# Patient Record
Sex: Male | Born: 1965 | Race: White | Hispanic: No | Marital: Single | Smoking: Never smoker
Health system: Southern US, Community
[De-identification: ages and names within clinical notes are randomized; demographics above are authoritative.]

## PROBLEM LIST (undated history)

## (undated) DIAGNOSIS — F29 Unspecified psychosis not due to a substance or known physiological condition: Secondary | ICD-10-CM

## (undated) DIAGNOSIS — F259 Schizoaffective disorder, unspecified: Secondary | ICD-10-CM

## (undated) DIAGNOSIS — G809 Cerebral palsy, unspecified: Secondary | ICD-10-CM

## (undated) DIAGNOSIS — K5900 Constipation, unspecified: Secondary | ICD-10-CM

## (undated) DIAGNOSIS — S88912A Complete traumatic amputation of left lower leg, level unspecified, initial encounter: Secondary | ICD-10-CM

## (undated) DIAGNOSIS — L720 Epidermal cyst: Secondary | ICD-10-CM

## (undated) DIAGNOSIS — S88911A Complete traumatic amputation of right lower leg, level unspecified, initial encounter: Secondary | ICD-10-CM

## (undated) HISTORY — PX: LEG AMPUTATION ABOVE KNEE: SHX117

---

## 2000-04-03 ENCOUNTER — Encounter: Admission: RE | Admit: 2000-04-03 | Discharge: 2000-07-02 | Payer: Self-pay | Admitting: Orthopedic Surgery

## 2001-04-13 ENCOUNTER — Encounter: Admission: RE | Admit: 2001-04-13 | Discharge: 2001-07-12 | Payer: Self-pay | Admitting: Internal Medicine

## 2002-12-20 ENCOUNTER — Encounter (HOSPITAL_BASED_OUTPATIENT_CLINIC_OR_DEPARTMENT_OTHER): Admission: RE | Admit: 2002-12-20 | Discharge: 2003-03-20 | Payer: Self-pay | Admitting: Internal Medicine

## 2002-12-29 ENCOUNTER — Encounter: Admission: RE | Admit: 2002-12-29 | Discharge: 2002-12-29 | Payer: Self-pay | Admitting: Internal Medicine

## 2002-12-29 ENCOUNTER — Encounter (HOSPITAL_BASED_OUTPATIENT_CLINIC_OR_DEPARTMENT_OTHER): Payer: Self-pay | Admitting: Internal Medicine

## 2003-03-18 ENCOUNTER — Encounter (HOSPITAL_BASED_OUTPATIENT_CLINIC_OR_DEPARTMENT_OTHER): Admission: RE | Admit: 2003-03-18 | Discharge: 2003-06-16 | Payer: Self-pay | Admitting: Internal Medicine

## 2003-05-01 ENCOUNTER — Emergency Department (HOSPITAL_COMMUNITY): Admission: EM | Admit: 2003-05-01 | Discharge: 2003-05-02 | Payer: Self-pay | Admitting: *Deleted

## 2003-07-04 ENCOUNTER — Encounter (HOSPITAL_BASED_OUTPATIENT_CLINIC_OR_DEPARTMENT_OTHER): Admission: RE | Admit: 2003-07-04 | Discharge: 2003-09-05 | Payer: Self-pay | Admitting: Internal Medicine

## 2003-11-15 ENCOUNTER — Encounter (HOSPITAL_BASED_OUTPATIENT_CLINIC_OR_DEPARTMENT_OTHER): Admission: RE | Admit: 2003-11-15 | Discharge: 2004-01-16 | Payer: Self-pay | Admitting: Internal Medicine

## 2004-03-13 ENCOUNTER — Encounter (HOSPITAL_BASED_OUTPATIENT_CLINIC_OR_DEPARTMENT_OTHER): Admission: RE | Admit: 2004-03-13 | Discharge: 2004-06-11 | Payer: Self-pay | Admitting: Internal Medicine

## 2004-08-13 ENCOUNTER — Encounter (HOSPITAL_BASED_OUTPATIENT_CLINIC_OR_DEPARTMENT_OTHER): Admission: RE | Admit: 2004-08-13 | Discharge: 2004-08-29 | Payer: Self-pay | Admitting: Internal Medicine

## 2005-02-27 ENCOUNTER — Encounter: Admission: RE | Admit: 2005-02-27 | Discharge: 2005-02-27 | Payer: Self-pay | Admitting: Internal Medicine

## 2005-09-23 ENCOUNTER — Ambulatory Visit: Payer: Self-pay | Admitting: Physical Medicine & Rehabilitation

## 2005-09-23 ENCOUNTER — Encounter (INDEPENDENT_AMBULATORY_CARE_PROVIDER_SITE_OTHER): Payer: Self-pay | Admitting: *Deleted

## 2005-09-23 ENCOUNTER — Inpatient Hospital Stay (HOSPITAL_COMMUNITY): Admission: RE | Admit: 2005-09-23 | Discharge: 2005-09-27 | Payer: Self-pay | Admitting: Vascular Surgery

## 2006-07-14 ENCOUNTER — Encounter: Admission: RE | Admit: 2006-07-14 | Discharge: 2006-10-12 | Payer: Self-pay | Admitting: Orthopaedic Surgery

## 2006-09-02 ENCOUNTER — Emergency Department (HOSPITAL_COMMUNITY): Admission: EM | Admit: 2006-09-02 | Discharge: 2006-09-02 | Payer: Self-pay | Admitting: Emergency Medicine

## 2006-12-10 ENCOUNTER — Emergency Department (HOSPITAL_COMMUNITY): Admission: EM | Admit: 2006-12-10 | Discharge: 2006-12-10 | Payer: Self-pay | Admitting: Emergency Medicine

## 2007-03-09 ENCOUNTER — Emergency Department (HOSPITAL_COMMUNITY): Admission: EM | Admit: 2007-03-09 | Discharge: 2007-03-09 | Payer: Self-pay | Admitting: Emergency Medicine

## 2007-03-31 ENCOUNTER — Encounter: Admission: RE | Admit: 2007-03-31 | Discharge: 2007-04-14 | Payer: Self-pay | Admitting: Internal Medicine

## 2007-04-15 ENCOUNTER — Emergency Department (HOSPITAL_COMMUNITY): Admission: EM | Admit: 2007-04-15 | Discharge: 2007-04-15 | Payer: Self-pay | Admitting: Emergency Medicine

## 2007-07-03 ENCOUNTER — Emergency Department (HOSPITAL_COMMUNITY): Admission: EM | Admit: 2007-07-03 | Discharge: 2007-07-03 | Payer: Self-pay | Admitting: Emergency Medicine

## 2007-07-20 ENCOUNTER — Emergency Department (HOSPITAL_COMMUNITY): Admission: EM | Admit: 2007-07-20 | Discharge: 2007-07-20 | Payer: Self-pay | Admitting: Emergency Medicine

## 2008-03-30 ENCOUNTER — Ambulatory Visit: Payer: Self-pay | Admitting: Internal Medicine

## 2008-03-30 ENCOUNTER — Inpatient Hospital Stay (HOSPITAL_COMMUNITY): Admission: AD | Admit: 2008-03-30 | Discharge: 2008-04-08 | Payer: Self-pay | Admitting: Internal Medicine

## 2008-03-30 ENCOUNTER — Encounter: Payer: Self-pay | Admitting: Emergency Medicine

## 2008-04-02 ENCOUNTER — Ambulatory Visit: Payer: Self-pay | Admitting: Infectious Diseases

## 2008-05-20 ENCOUNTER — Inpatient Hospital Stay (HOSPITAL_COMMUNITY): Admission: EM | Admit: 2008-05-20 | Discharge: 2008-05-25 | Payer: Self-pay | Admitting: Emergency Medicine

## 2008-05-20 ENCOUNTER — Ambulatory Visit: Payer: Self-pay | Admitting: Internal Medicine

## 2008-07-02 ENCOUNTER — Inpatient Hospital Stay (HOSPITAL_COMMUNITY): Admission: EM | Admit: 2008-07-02 | Discharge: 2008-07-05 | Payer: Self-pay | Admitting: Emergency Medicine

## 2008-08-11 ENCOUNTER — Emergency Department (HOSPITAL_COMMUNITY): Admission: EM | Admit: 2008-08-11 | Discharge: 2008-08-12 | Payer: Self-pay | Admitting: Emergency Medicine

## 2008-09-01 ENCOUNTER — Emergency Department (HOSPITAL_COMMUNITY): Admission: EM | Admit: 2008-09-01 | Discharge: 2008-09-01 | Payer: Self-pay | Admitting: Emergency Medicine

## 2008-10-04 ENCOUNTER — Inpatient Hospital Stay (HOSPITAL_COMMUNITY): Admission: EM | Admit: 2008-10-04 | Discharge: 2008-10-15 | Payer: Self-pay | Admitting: Emergency Medicine

## 2008-12-30 ENCOUNTER — Emergency Department (HOSPITAL_COMMUNITY): Admission: EM | Admit: 2008-12-30 | Discharge: 2008-12-30 | Payer: Self-pay | Admitting: Emergency Medicine

## 2009-01-07 ENCOUNTER — Emergency Department (HOSPITAL_COMMUNITY): Admission: EM | Admit: 2009-01-07 | Discharge: 2009-01-07 | Payer: Self-pay | Admitting: Emergency Medicine

## 2009-04-01 ENCOUNTER — Emergency Department (HOSPITAL_COMMUNITY): Admission: EM | Admit: 2009-04-01 | Discharge: 2009-04-01 | Payer: Self-pay | Admitting: Emergency Medicine

## 2010-04-17 IMAGING — CR DG CHEST 1V PORT
1 series · 1 of 1 positions shown · non-contrast
Comparison: 03/13/2008 at 3933 hours

CLINICAL DATA: Sepsis/fever

PORTABLE CHEST - 1 VIEW

[AP]
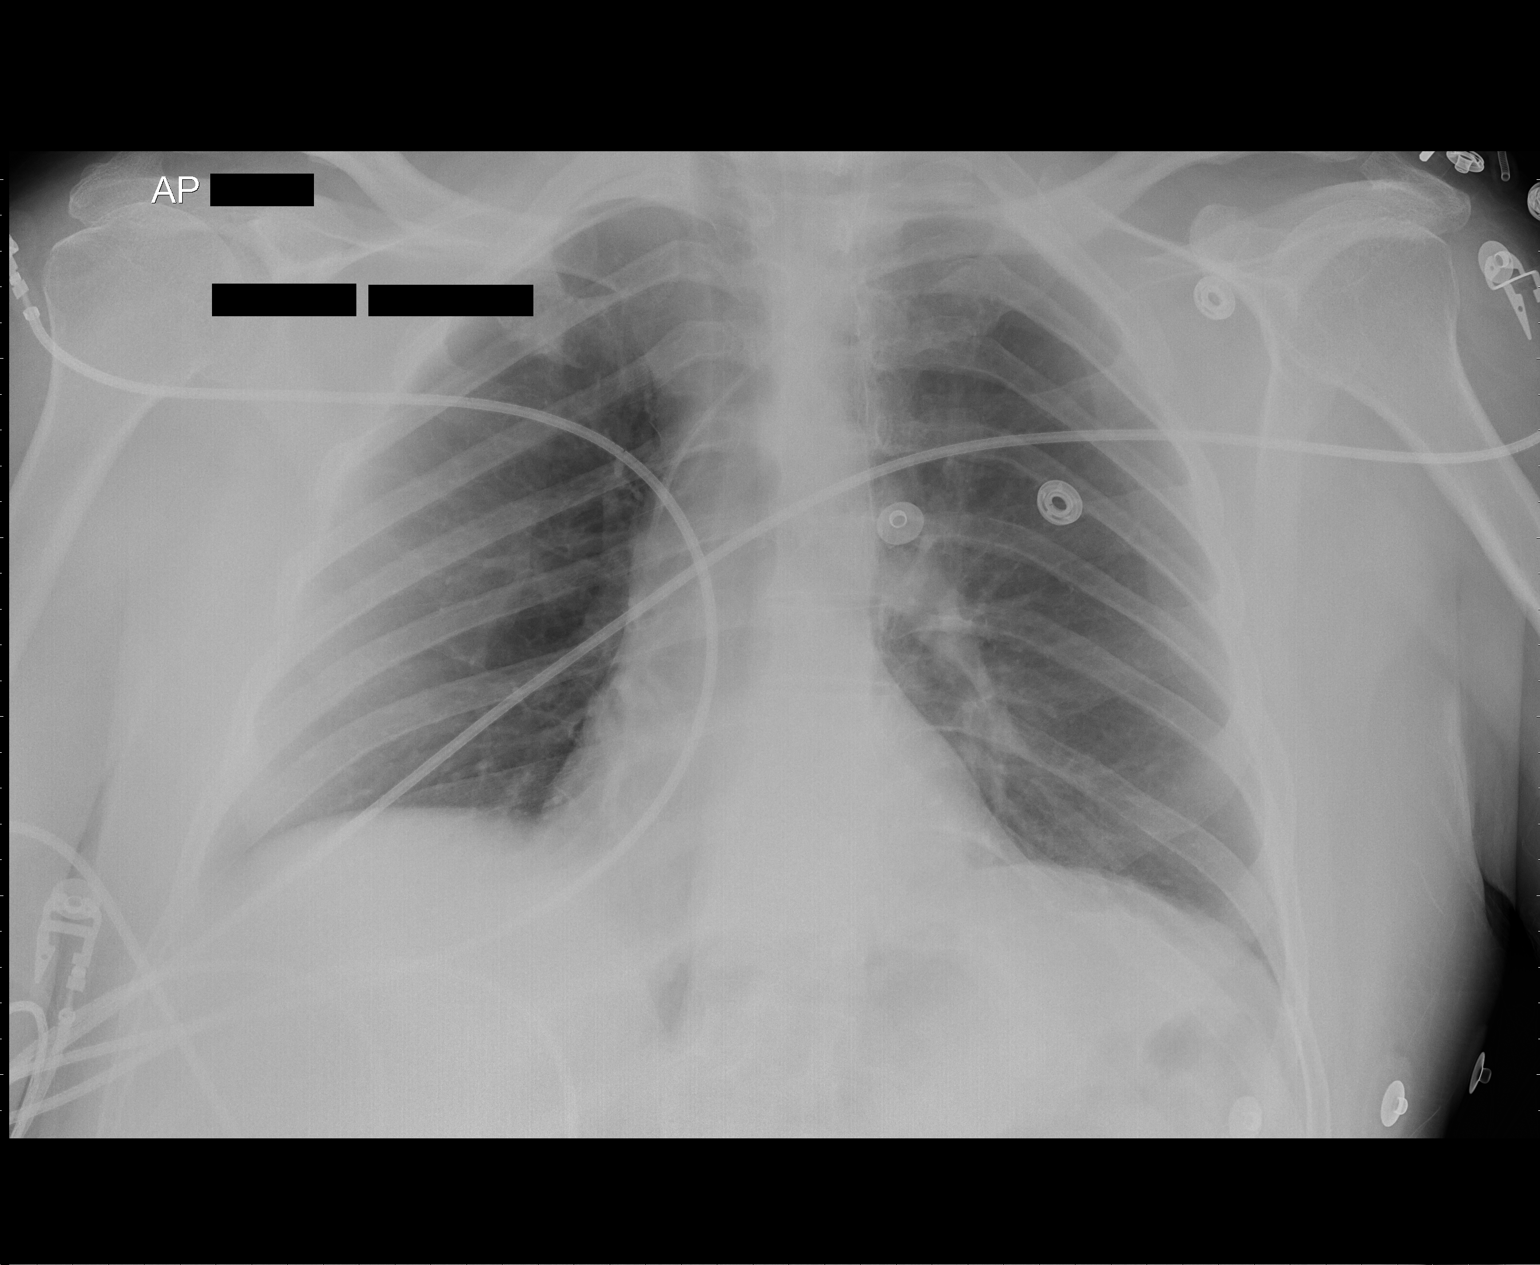

[1 of 1 positions shown; findings below may reference images not displayed]

FINDINGS: Heart size and contour remain normal.  Lungs clear.  A
left jugular central line has been placed with its tip in the
proximal SVC.  There is no pneumothorax. The stomach is no longer
distended.
IMPRESSION: 1.  No active cardiopulmonary disease.
2.  Left jugular central line placement as above with no immediate
complications.
3.  The stomach is no longer distended.

## 2010-04-18 IMAGING — CR DG ABD PORTABLE 1V
1 series · 1 of 1 positions shown · non-contrast
Comparison: CT abdomen pelvis 03/30/2008.

CLINICAL DATA: 41-year-old male with sepsis and fever.

ABDOMEN - 1 VIEW

[view not recorded]
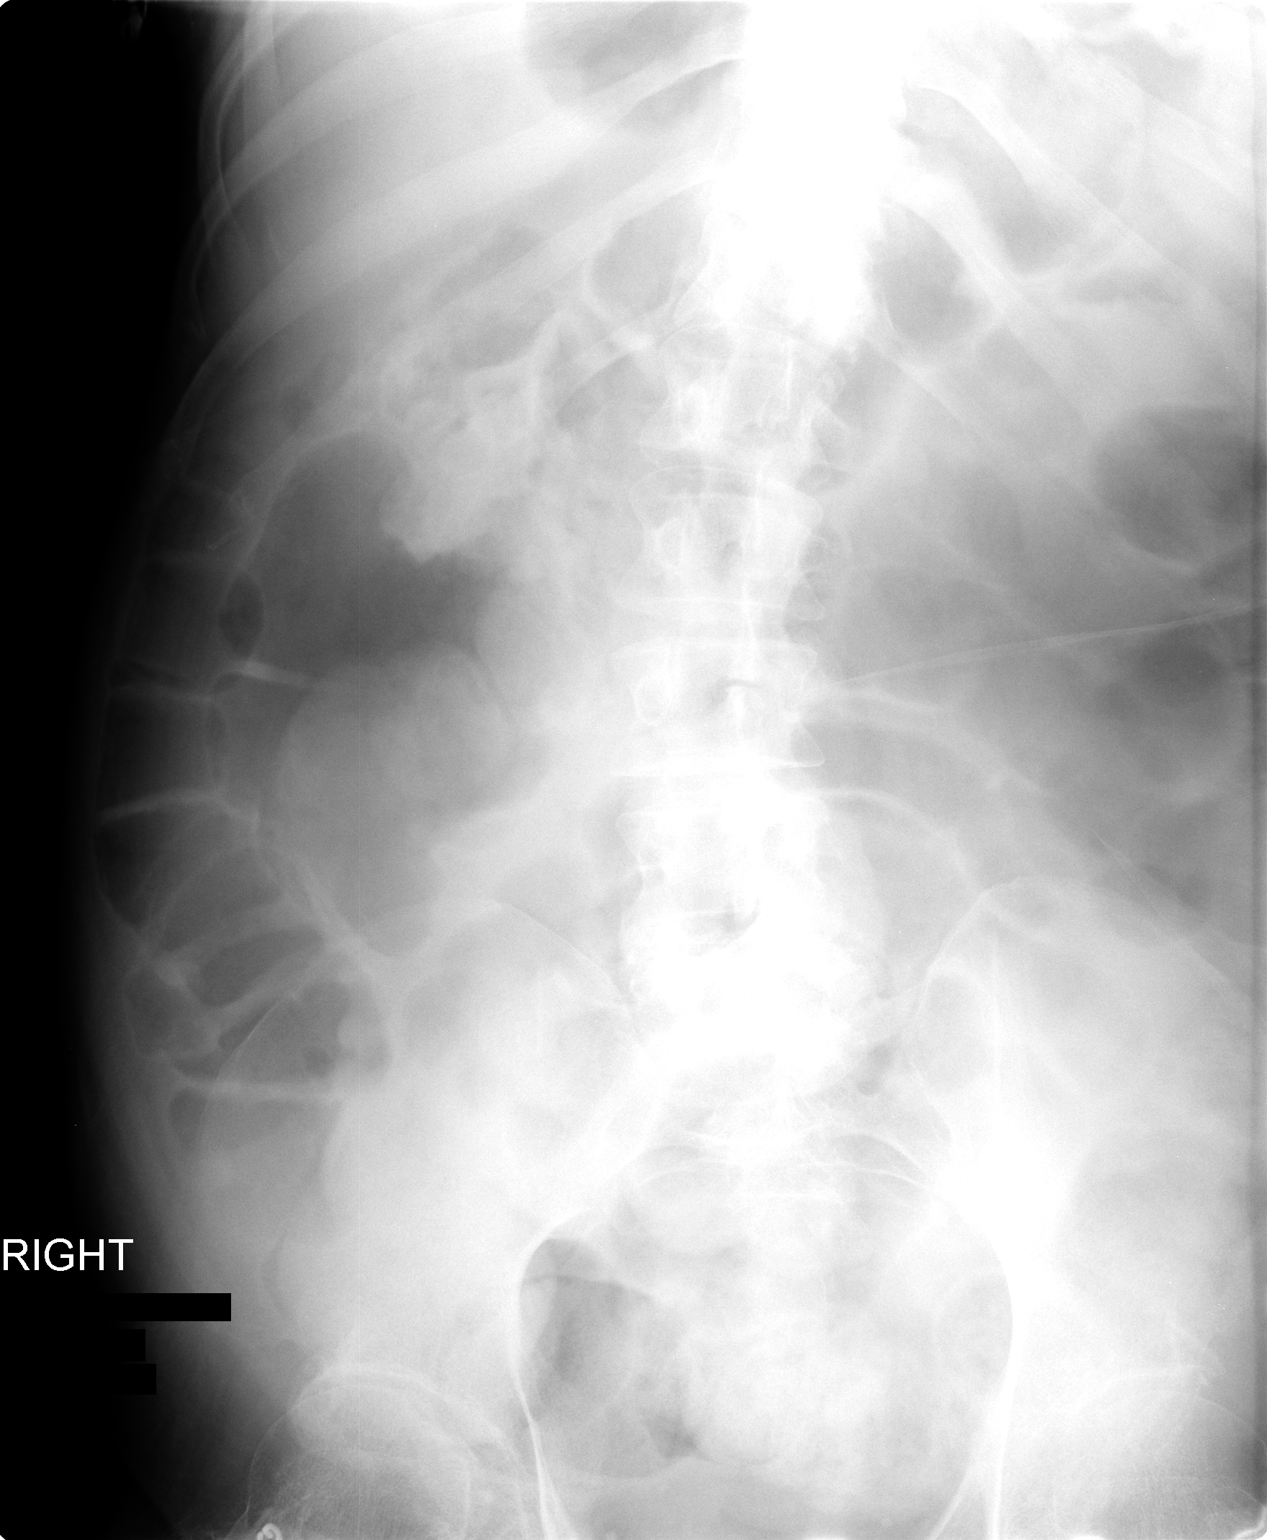

[1 of 1 positions shown; findings below may reference images not displayed]

FINDINGS: Portable view 7437 hours.  Increased gaseous distension
of bowel loops, but no definitely dilated loops are identified.
Visualized small bowel loops measure up to 3 cm in diameter, within
normal limits.  Retained CT oral contrast is evident.  Scoliosis.
The lung bases are not included.
IMPRESSION: Bowel gas pattern suggestive of ileus.  Retained CT oral contrast.

## 2010-10-04 LAB — DIFFERENTIAL
Basophils Absolute: 0 10*3/uL (ref 0.0–0.1)
Basophils Relative: 0 % (ref 0–1)
Eosinophils Absolute: 0.2 10*3/uL (ref 0.0–0.7)
Monocytes Absolute: 0.8 10*3/uL (ref 0.1–1.0)
Monocytes Relative: 5 % (ref 3–12)
Neutro Abs: 11.4 10*3/uL — ABNORMAL HIGH (ref 1.7–7.7)
Neutrophils Relative %: 81 % — ABNORMAL HIGH (ref 43–77)

## 2010-10-04 LAB — BASIC METABOLIC PANEL
CO2: 20 mEq/L (ref 19–32)
Calcium: 8.8 mg/dL (ref 8.4–10.5)
Chloride: 104 mEq/L (ref 96–112)
Creatinine, Ser: 0.63 mg/dL (ref 0.4–1.5)
Glucose, Bld: 106 mg/dL — ABNORMAL HIGH (ref 70–99)

## 2010-10-04 LAB — CBC
MCHC: 33.7 g/dL (ref 30.0–36.0)
MCV: 94.4 fL (ref 78.0–100.0)
RDW: 15.2 % (ref 11.5–15.5)

## 2010-10-07 LAB — DIFFERENTIAL
Basophils Relative: 0 % (ref 0–1)
Eosinophils Absolute: 0.1 10*3/uL (ref 0.0–0.7)
Monocytes Absolute: 0.7 10*3/uL (ref 0.1–1.0)
Monocytes Relative: 6 % (ref 3–12)

## 2010-10-07 LAB — HEPATIC FUNCTION PANEL
AST: 54 U/L — ABNORMAL HIGH (ref 0–37)
Albumin: 3.4 g/dL — ABNORMAL LOW (ref 3.5–5.2)
Bilirubin, Direct: <0.1 mg/dL (ref 0.0–0.3)
Total Bilirubin: 0.4 mg/dL (ref 0.3–1.2)

## 2010-10-07 LAB — CBC
HCT: 44.5 % (ref 39.0–52.0)
Hemoglobin: 15.1 g/dL (ref 13.0–17.0)
MCHC: 34 g/dL (ref 30.0–36.0)
MCV: 93.4 fL (ref 78.0–100.0)
RBC: 4.77 MIL/uL (ref 4.22–5.81)

## 2010-10-07 LAB — POCT I-STAT, CHEM 8
BUN: 10 mg/dL (ref 6–23)
Calcium, Ion: 1.14 mmol/L (ref 1.12–1.32)
Calcium, Ion: 1.19 mmol/L (ref 1.12–1.32)
Chloride: 107 mEq/L (ref 96–112)
Creatinine, Ser: 0.6 mg/dL (ref 0.4–1.5)
Glucose, Bld: 138 mg/dL — ABNORMAL HIGH (ref 70–99)
HCT: 42 % (ref 39.0–52.0)
HCT: 44 % (ref 39.0–52.0)
Hemoglobin: 15 g/dL (ref 13.0–17.0)
Sodium: 139 mEq/L (ref 135–145)
TCO2: 27 mmol/L (ref 0–100)

## 2010-10-07 LAB — URINALYSIS, ROUTINE W REFLEX MICROSCOPIC
Ketones, ur: NEGATIVE mg/dL
Nitrite: NEGATIVE
Protein, ur: NEGATIVE mg/dL
Urobilinogen, UA: 1 mg/dL (ref 0.0–1.0)

## 2010-10-07 LAB — LIPASE, BLOOD: Lipase: 24 U/L (ref 11–59)

## 2010-10-07 LAB — LACTIC ACID, PLASMA: Lactic Acid, Venous: 1.6 mmol/L (ref 0.5–2.2)

## 2010-10-07 LAB — POCT CARDIAC MARKERS: Troponin i, poc: <0.05 ng/mL (ref 0.00–0.09)

## 2010-10-10 LAB — URINALYSIS, ROUTINE W REFLEX MICROSCOPIC
Glucose, UA: NEGATIVE mg/dL
Hgb urine dipstick: NEGATIVE
Specific Gravity, Urine: 1.021 (ref 1.005–1.030)

## 2010-10-10 LAB — COMPREHENSIVE METABOLIC PANEL
ALT: 24 U/L (ref 0–53)
AST: 23 U/L (ref 0–37)
AST: 34 U/L (ref 0–37)
Albumin: 3 g/dL — ABNORMAL LOW (ref 3.5–5.2)
Albumin: 4.1 g/dL (ref 3.5–5.2)
Alkaline Phosphatase: 56 U/L (ref 39–117)
BUN: 6 mg/dL (ref 6–23)
CO2: 19 mEq/L (ref 19–32)
CO2: 20 mEq/L (ref 19–32)
Calcium: 9.5 mg/dL (ref 8.4–10.5)
Chloride: 101 mEq/L (ref 96–112)
Chloride: 112 mEq/L (ref 96–112)
Creatinine, Ser: 0.57 mg/dL (ref 0.4–1.5)
Creatinine, Ser: 0.6 mg/dL (ref 0.4–1.5)
GFR calc Af Amer: >60 mL/min (ref >60)
GFR calc Af Amer: >60 mL/min (ref >60)
GFR calc non Af Amer: >60 mL/min (ref >60)
GFR calc non Af Amer: >60 mL/min (ref >60)
Potassium: 4.2 mEq/L (ref 3.5–5.1)
Sodium: 139 mEq/L (ref 135–145)
Total Bilirubin: 1.2 mg/dL (ref 0.3–1.2)

## 2010-10-10 LAB — CBC
HCT: 38.3 % — ABNORMAL LOW (ref 39.0–52.0)
HCT: 35 % — ABNORMAL LOW (ref 39.0–52.0)
HCT: 37.9 % — ABNORMAL LOW (ref 39.0–52.0)
HCT: 38.4 % — ABNORMAL LOW (ref 39.0–52.0)
HCT: 40.3 % (ref 39.0–52.0)
HCT: 41.8 % (ref 39.0–52.0)
HCT: 48.2 % (ref 39.0–52.0)
Hemoglobin: 12 g/dL — ABNORMAL LOW (ref 13.0–17.0)
Hemoglobin: 13 g/dL (ref 13.0–17.0)
Hemoglobin: 13.4 g/dL (ref 13.0–17.0)
Hemoglobin: 13.4 g/dL (ref 13.0–17.0)
Hemoglobin: 13.5 g/dL (ref 13.0–17.0)
Hemoglobin: 16.8 g/dL (ref 13.0–17.0)
MCHC: 33.9 g/dL (ref 30.0–36.0)
MCHC: 34.4 g/dL (ref 30.0–36.0)
MCHC: 34.6 g/dL (ref 30.0–36.0)
MCHC: 34.8 g/dL (ref 30.0–36.0)
MCHC: 35.3 g/dL (ref 30.0–36.0)
MCHC: 35.3 g/dL (ref 30.0–36.0)
MCV: 98.3 fL (ref 78.0–100.0)
MCV: 98.5 fL (ref 78.0–100.0)
MCV: 98.6 fL (ref 78.0–100.0)
MCV: 98.8 fL (ref 78.0–100.0)
MCV: 98.8 fL (ref 78.0–100.0)
Platelets: 311 10*3/uL (ref 150–400)
Platelets: 226 10*3/uL (ref 150–400)
Platelets: 243 10*3/uL (ref 150–400)
Platelets: 258 10*3/uL (ref 150–400)
Platelets: 264 10*3/uL (ref 150–400)
Platelets: 265 10*3/uL (ref 150–400)
Platelets: ADEQUATE 10*3/uL (ref 150–400)
RBC: 3.5 MIL/uL — ABNORMAL LOW (ref 4.22–5.81)
RBC: 3.52 MIL/uL — ABNORMAL LOW (ref 4.22–5.81)
RBC: 3.65 MIL/uL — ABNORMAL LOW (ref 4.22–5.81)
RBC: 3.85 MIL/uL — ABNORMAL LOW (ref 4.22–5.81)
RBC: 3.89 MIL/uL — ABNORMAL LOW (ref 4.22–5.81)
RBC: 3.93 MIL/uL — ABNORMAL LOW (ref 4.22–5.81)
RBC: 4.08 MIL/uL — ABNORMAL LOW (ref 4.22–5.81)
RBC: 4.88 MIL/uL (ref 4.22–5.81)
RDW: 13.6 % (ref 11.5–15.5)
RDW: 13.3 % (ref 11.5–15.5)
RDW: 13.4 % (ref 11.5–15.5)
RDW: 13.4 % (ref 11.5–15.5)
RDW: 13.6 % (ref 11.5–15.5)
RDW: 13.7 % (ref 11.5–15.5)
RDW: 13.9 % (ref 11.5–15.5)
WBC: 7.4 10*3/uL (ref 4.0–10.5)
WBC: 11.3 10*3/uL — ABNORMAL HIGH (ref 4.0–10.5)
WBC: 11.6 10*3/uL — ABNORMAL HIGH (ref 4.0–10.5)
WBC: 15.2 10*3/uL — ABNORMAL HIGH (ref 4.0–10.5)
WBC: 6.3 10*3/uL (ref 4.0–10.5)
WBC: 9.1 10*3/uL (ref 4.0–10.5)
WBC: 9.7 10*3/uL (ref 4.0–10.5)

## 2010-10-10 LAB — BASIC METABOLIC PANEL
BUN: 1 mg/dL — ABNORMAL LOW (ref 6–23)
BUN: 2 mg/dL — ABNORMAL LOW (ref 6–23)
BUN: 1 mg/dL — ABNORMAL LOW (ref 6–23)
BUN: 2 mg/dL — ABNORMAL LOW (ref 6–23)
BUN: <1 mg/dL — ABNORMAL LOW (ref 6–23)
BUN: <1 mg/dL — ABNORMAL LOW (ref 6–23)
BUN: <1 mg/dL — ABNORMAL LOW (ref 6–23)
CO2: 11 mEq/L — ABNORMAL LOW (ref 19–32)
CO2: 14 mEq/L — ABNORMAL LOW (ref 19–32)
CO2: 23 mEq/L (ref 19–32)
CO2: 23 mEq/L (ref 19–32)
Calcium: 8.8 mg/dL (ref 8.4–10.5)
Calcium: 8.4 mg/dL (ref 8.4–10.5)
Calcium: 8.5 mg/dL (ref 8.4–10.5)
Calcium: 8.9 mg/dL (ref 8.4–10.5)
Chloride: 103 mEq/L (ref 96–112)
Chloride: 104 mEq/L (ref 96–112)
Chloride: 111 mEq/L (ref 96–112)
Chloride: 111 mEq/L (ref 96–112)
Chloride: 115 mEq/L — ABNORMAL HIGH (ref 96–112)
Chloride: 115 mEq/L — ABNORMAL HIGH (ref 96–112)
Creatinine, Ser: 0.57 mg/dL (ref 0.4–1.5)
Creatinine, Ser: 0.39 mg/dL — ABNORMAL LOW (ref 0.4–1.5)
Creatinine, Ser: 0.48 mg/dL (ref 0.4–1.5)
Creatinine, Ser: 0.49 mg/dL (ref 0.4–1.5)
Creatinine, Ser: 0.58 mg/dL (ref 0.4–1.5)
Creatinine, Ser: 0.75 mg/dL (ref 0.4–1.5)
GFR calc Af Amer: >60 mL/min (ref >60)
GFR calc Af Amer: >60 mL/min (ref >60)
GFR calc Af Amer: >60 mL/min (ref >60)
GFR calc Af Amer: >60 mL/min (ref >60)
GFR calc Af Amer: >60 mL/min (ref >60)
GFR calc non Af Amer: >60 mL/min (ref >60)
GFR calc non Af Amer: >60 mL/min (ref >60)
GFR calc non Af Amer: >60 mL/min (ref >60)
GFR calc non Af Amer: >60 mL/min (ref >60)
GFR calc non Af Amer: >60 mL/min (ref >60)
Glucose, Bld: 152 mg/dL — ABNORMAL HIGH (ref 70–99)
Glucose, Bld: 376 mg/dL — ABNORMAL HIGH (ref 70–99)
Glucose, Bld: 66 mg/dL — ABNORMAL LOW (ref 70–99)
Glucose, Bld: 68 mg/dL — ABNORMAL LOW (ref 70–99)
Potassium: 4 mEq/L (ref 3.5–5.1)
Potassium: 4 mEq/L (ref 3.5–5.1)
Potassium: 3 mEq/L — ABNORMAL LOW (ref 3.5–5.1)
Potassium: 3.3 mEq/L — ABNORMAL LOW (ref 3.5–5.1)
Potassium: 3.3 mEq/L — ABNORMAL LOW (ref 3.5–5.1)
Potassium: 3.6 mEq/L (ref 3.5–5.1)
Potassium: 3.9 mEq/L (ref 3.5–5.1)
Potassium: 6 mEq/L — ABNORMAL HIGH (ref 3.5–5.1)
Sodium: 136 mEq/L (ref 135–145)
Sodium: 137 mEq/L (ref 135–145)
Sodium: 139 mEq/L (ref 135–145)
Sodium: 140 mEq/L (ref 135–145)
Sodium: 141 mEq/L (ref 135–145)
Sodium: 143 mEq/L (ref 135–145)

## 2010-10-10 LAB — GLUCOSE, CAPILLARY
Glucose-Capillary: 109 mg/dL — ABNORMAL HIGH (ref 70–99)
Glucose-Capillary: 110 mg/dL — ABNORMAL HIGH (ref 70–99)
Glucose-Capillary: 113 mg/dL — ABNORMAL HIGH (ref 70–99)
Glucose-Capillary: 115 mg/dL — ABNORMAL HIGH (ref 70–99)
Glucose-Capillary: 98 mg/dL (ref 70–99)
Glucose-Capillary: 110 mg/dL — ABNORMAL HIGH (ref 70–99)
Glucose-Capillary: 111 mg/dL — ABNORMAL HIGH (ref 70–99)
Glucose-Capillary: 112 mg/dL — ABNORMAL HIGH (ref 70–99)
Glucose-Capillary: 120 mg/dL — ABNORMAL HIGH (ref 70–99)
Glucose-Capillary: 127 mg/dL — ABNORMAL HIGH (ref 70–99)
Glucose-Capillary: 144 mg/dL — ABNORMAL HIGH (ref 70–99)
Glucose-Capillary: 145 mg/dL — ABNORMAL HIGH (ref 70–99)
Glucose-Capillary: 148 mg/dL — ABNORMAL HIGH (ref 70–99)
Glucose-Capillary: 150 mg/dL — ABNORMAL HIGH (ref 70–99)
Glucose-Capillary: 92 mg/dL (ref 70–99)
Glucose-Capillary: 93 mg/dL (ref 70–99)

## 2010-10-10 LAB — DIFFERENTIAL
Basophils Absolute: 0 10*3/uL (ref 0.0–0.1)
Basophils Absolute: 0 10*3/uL (ref 0.0–0.1)
Basophils Absolute: 0.1 10*3/uL (ref 0.0–0.1)
Basophils Relative: 0 % (ref 0–1)
Basophils Relative: 0 % (ref 0–1)
Basophils Relative: 1 % (ref 0–1)
Eosinophils Absolute: 0 10*3/uL (ref 0.0–0.7)
Eosinophils Absolute: 0.2 10*3/uL (ref 0.0–0.7)
Eosinophils Absolute: 0.2 10*3/uL (ref 0.0–0.7)
Eosinophils Absolute: 0.3 10*3/uL (ref 0.0–0.7)
Eosinophils Relative: 0 % (ref 0–5)
Lymphocytes Relative: 17 % (ref 12–46)
Lymphs Abs: 1.7 10*3/uL (ref 0.7–4.0)
Monocytes Absolute: 0.5 10*3/uL (ref 0.1–1.0)
Monocytes Absolute: 0.8 10*3/uL (ref 0.1–1.0)
Monocytes Absolute: 1.1 10*3/uL — ABNORMAL HIGH (ref 0.1–1.0)
Neutro Abs: 13.8 10*3/uL — ABNORMAL HIGH (ref 1.7–7.7)
Neutro Abs: 4.5 10*3/uL (ref 1.7–7.7)
Neutro Abs: 8.2 10*3/uL — ABNORMAL HIGH (ref 1.7–7.7)
Neutrophils Relative %: 67 % (ref 43–77)
Neutrophils Relative %: 72 % (ref 43–77)
Neutrophils Relative %: 81 % — ABNORMAL HIGH (ref 43–77)

## 2010-10-10 LAB — PHOSPHORUS
Phosphorus: 2.8 mg/dL (ref 2.3–4.6)
Phosphorus: 3 mg/dL (ref 2.3–4.6)

## 2010-10-10 LAB — TYPE AND SCREEN
ABO/RH(D): A POS
Antibody Screen: NEGATIVE

## 2010-10-10 LAB — CULTURE, BLOOD (ROUTINE X 2)

## 2010-10-10 LAB — HEMOCCULT GUIAC POC 1CARD (OFFICE): Fecal Occult Bld: POSITIVE

## 2010-10-10 LAB — MAGNESIUM: Magnesium: 1.5 mg/dL (ref 1.5–2.5)

## 2010-10-10 LAB — H. PYLORI ANTIBODY, IGG: H Pylori IgG: 0.5 {ISR}

## 2010-10-11 LAB — BASIC METABOLIC PANEL
BUN: 8 mg/dL (ref 6–23)
CO2: 25 mEq/L (ref 19–32)
Chloride: 108 mEq/L (ref 96–112)
Creatinine, Ser: 0.55 mg/dL (ref 0.4–1.5)
Glucose, Bld: 100 mg/dL — ABNORMAL HIGH (ref 70–99)

## 2010-10-11 LAB — URINALYSIS, ROUTINE W REFLEX MICROSCOPIC
Bilirubin Urine: NEGATIVE
Ketones, ur: NEGATIVE mg/dL
Nitrite: NEGATIVE
Protein, ur: NEGATIVE mg/dL
Urobilinogen, UA: 1 mg/dL (ref 0.0–1.0)

## 2010-10-11 LAB — DIFFERENTIAL
Basophils Absolute: 0 10*3/uL (ref 0.0–0.1)
Basophils Relative: 1 % (ref 0–1)
Eosinophils Absolute: 0.1 10*3/uL (ref 0.0–0.7)
Eosinophils Relative: 2 % (ref 0–5)
Monocytes Absolute: 0.5 10*3/uL (ref 0.1–1.0)

## 2010-10-11 LAB — CBC
MCHC: 33.7 g/dL (ref 30.0–36.0)
MCV: 98.8 fL (ref 78.0–100.0)
Platelets: 259 10*3/uL (ref 150–400)
RBC: 4.71 MIL/uL (ref 4.22–5.81)

## 2010-10-15 LAB — DIFFERENTIAL
Basophils Absolute: 0 10*3/uL (ref 0.0–0.1)
Eosinophils Absolute: 0.1 10*3/uL (ref 0.0–0.7)
Eosinophils Relative: 1 % (ref 0–5)
Lymphocytes Relative: 19 % (ref 12–46)
Neutrophils Relative %: 71 % (ref 43–77)

## 2010-10-15 LAB — CBC
HCT: 49.4 % (ref 39.0–52.0)
Platelets: 327 10*3/uL (ref 150–400)
RDW: 13.8 % (ref 11.5–15.5)

## 2010-10-15 LAB — BASIC METABOLIC PANEL
BUN: 6 mg/dL (ref 6–23)
CO2: 22 mEq/L (ref 19–32)
CO2: 23 mEq/L (ref 19–32)
Chloride: 106 mEq/L (ref 96–112)
Creatinine, Ser: 0.69 mg/dL (ref 0.4–1.5)
GFR calc Af Amer: >60 mL/min (ref >60)
GFR calc non Af Amer: >60 mL/min (ref >60)
Glucose, Bld: 100 mg/dL — ABNORMAL HIGH (ref 70–99)
Glucose, Bld: 99 mg/dL (ref 70–99)
Potassium: 3.4 mEq/L — ABNORMAL LOW (ref 3.5–5.1)
Potassium: 4.1 mEq/L (ref 3.5–5.1)
Sodium: 137 mEq/L (ref 135–145)
Sodium: 139 mEq/L (ref 135–145)

## 2010-10-15 LAB — COMPREHENSIVE METABOLIC PANEL
Albumin: 3.4 g/dL — ABNORMAL LOW (ref 3.5–5.2)
BUN: 2 mg/dL — ABNORMAL LOW (ref 6–23)
Creatinine, Ser: 0.49 mg/dL (ref 0.4–1.5)
Potassium: 4.1 mEq/L (ref 3.5–5.1)
Total Protein: 7 g/dL (ref 6.0–8.3)

## 2010-10-15 LAB — GLUCOSE, CAPILLARY: Glucose-Capillary: 102 mg/dL — ABNORMAL HIGH (ref 70–99)

## 2010-10-16 LAB — DIFFERENTIAL
Basophils Absolute: 0 10*3/uL (ref 0.0–0.1)
Eosinophils Relative: 2 % (ref 0–5)
Lymphocytes Relative: 24 % (ref 12–46)
Lymphs Abs: 1.8 10*3/uL (ref 0.7–4.0)
Monocytes Absolute: 0.5 10*3/uL (ref 0.1–1.0)
Monocytes Relative: 6 % (ref 3–12)

## 2010-10-16 LAB — URINALYSIS, ROUTINE W REFLEX MICROSCOPIC
Bilirubin Urine: NEGATIVE
Hgb urine dipstick: NEGATIVE
Nitrite: NEGATIVE
Specific Gravity, Urine: 1.018 (ref 1.005–1.030)
pH: 7.5 (ref 5.0–8.0)

## 2010-10-16 LAB — BASIC METABOLIC PANEL
CO2: 26 mEq/L (ref 19–32)
GFR calc non Af Amer: >60 mL/min (ref >60)
Glucose, Bld: 105 mg/dL — ABNORMAL HIGH (ref 70–99)
Potassium: 4 mEq/L (ref 3.5–5.1)
Sodium: 137 mEq/L (ref 135–145)

## 2010-10-16 LAB — CBC
HCT: 51 % (ref 39.0–52.0)
Hemoglobin: 17.4 g/dL — ABNORMAL HIGH (ref 13.0–17.0)
RBC: 5.19 MIL/uL (ref 4.22–5.81)
RDW: 13.4 % (ref 11.5–15.5)

## 2010-11-13 NOTE — Discharge Summary (Signed)
[name], [name]                ACCOUNT NO.:  [id number]   MEDICAL RECORD NO.:  [id number]          PATIENT TYPE:  INP   LOCATION:  3032                         FACILITY:  [facility]   PHYSICIAN:  Ladell Pier, M.D.   DATE OF BIRTH:  28-Jul-1965   DATE OF ADMISSION:  10/04/2008  DATE OF DISCHARGE:  10/15/2008                               DISCHARGE SUMMARY   ADDENDUM:   DISCHARGE DIAGNOSIS:  As per Dr. Belia Heman. Comer's dictation.   DISCHARGE MEDICATIONS:  1. Diazepam 5 mg daily.  2. Fluoxetine 40 mg daily.  3. Tizanidine 2 mg t.i.d.  4. Omeprazole 20 mg twice daily.  5. Reglan 5 mg b.i.d.  6. Benzamine 2 mg t.i.d.  7. Amitiza 24 mcg b.i.d.  8. Zyprexa 20 mg nightly.  9. Baclofen 10 mg nightly.  10.Meclizine 25 mg daily.  11.Keflex 250 mg q.i.d. times 6 days.  12.Erythromycin 500 mg four times a day.  Will verify with GI.  Dr.      Petra Kuba is on call. I paged him to verify the erythromycin.  13.The patient to be discharged with MiraLax 17 grams in 8 ounces of      water twice daily.   FOLLOW-UP APPOINTMENTS:  1. The patient was told to follow up with Dr. Carman Ching in two      weeks.  2. To follow up with Dr. __________ in one week.   HOSPITAL COURSE:  As per Dr. Ephriam Knuckles discharge summary, the patient was  added admitted with ileus.  GI was consulted who saw the patient  throughout the hospitalization.  He had a rectal tube placed and he was  given MiraLax and placed on erythromycin throughout the course of his  hospitalization.  His ileus improved clinically and also by x-ray.   The patient will be discharged home, to follow up with GI and also with  his primary care doctor.   LABORATORY DATA:  Labs at the time of discharge revealed on a BMP:  Sodium 137, potassium 4.0, chloride 104, CO2 of 27, glucose 118, BUN  2.creatinine 0.57, calcium 8.8.  WBC 7.4, hemoglobin 13.5, platelets  311, MCV 98.2.  Abdominal x-ray shows improved ileus.      Ladell Pier,  M.D.     [place]/MEDQ  D:  10/15/2008  T:  10/15/2008  Job:  [id number]

## 2010-11-13 NOTE — Op Note (Signed)
NAMEJOAS, MOTTON NO.:  [id number]   MEDICAL RECORD NO.:  [id number]          PATIENT TYPE:  EMS   LOCATION:  MAJO                         FACILITY:  [facility]   PHYSICIAN:  Nelda Bucks, MD DATE OF BIRTH:  [birth year]   DATE OF PROCEDURE:  DATE OF DISCHARGE:  03/29/2008                               OPERATIVE REPORT   Central line placement.   Consent was obtained from the patient, was alert and oriented x3.  He  understands the risks factors including infection, bleeding, and  pneumothorax.  The patient was placed in slightly Trendelenburg  position.  Chlorhexidine preparation was used over left internal jugular  site for sterilization.  Ultrasound guidance was used in a sterile  fashion for this procedure.  Ultrasound identified in left IJ appeared  somewhat dry with some collapsing during inspiration with Trendelenburg  position.  This IJ improved to some degree, although clearly appeared  hypovolemic.  A 7 mL of lidocaine was insufflated into the surgical  site.  Seldinger technique was used using guidewire.  Placement which  was also directly visualized under ultrasound.  A [place] line was placed  approximately 20 cm.  The patient tolerated the procedure well.  Limited  blood loss of less than 1 mL with no hemodynamic insult.  Appropriate  chest x-ray is pending at this time.      Nelda Bucks, MD  Electronically Signed     DJF/MEDQ  D:  03/30/2008  T:  03/31/2008  Job:  [id number]

## 2010-11-13 NOTE — H&P (Signed)
NAMEADVAITH, LAMARQUE                ACCOUNT NO.:  [id number]   MEDICAL RECORD NO.:  [id number]          PATIENT TYPE:  INP   LOCATION:  3032                         FACILITY:  [facility]   PHYSICIAN:  Renee Ramus, MD       DATE OF BIRTH:  Apr 23, 1966   DATE OF ADMISSION:  10/04/2008  DATE OF DISCHARGE:                              HISTORY & PHYSICAL   PRIMARY CARE PHYSICIAN:  Albertina Senegal, MD in [facility].   HISTORY OF PRESENT ILLNESS:  The patient is a 45 year old male with a  longstanding history of multiple admissions for recurrent ileus.  The  patient now complains of nausea, vomiting, and low-grade fever.  The  patient has been sunning himself and does have erythema on his skin  consistent with mild sunburn.  The patient reports having progressive  nausea and vomiting consistent with his previous admissions for ileus.  The patient is now being admitted to our service for further evaluation  and treatment.  The patient did have an x-ray in the emergency room that  showed a question of ileus versus Ogilvie syndrome, but the patient is  already beginning to feel better and hopefully, this will pass as the  previous episodes have.   PAST MEDICAL HISTORY:  1. Cerebral palsy.  2. Mental retardation.  3. 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  4. Type 2 diabetes mellitus, not treated.  5. Gastroesophageal reflux disease.  6. Peripheral vascular disease.  7. Anxiety.  8. Depression.  9. Status post esophageal dilatation.  10.Status post several admissions for ileus.   SOCIAL HISTORY:  No alcohol, no tobacco use.   FAMILY HISTORY:  Not available.   REVIEW OF SYSTEMS:  All other comprehensive review of systems is  negative.   ALLERGIES:  The patient has no known drug allergies.   CURRENT MEDICATIONS:  1. Diazepam 5 mg p.o. daily.  2. Fluoxetine 40 mg p.o. daily.  3. Tizanidine hydrochloride 2 mg p.o. t.i.d.  4. Omeprazole 20 mg p.o. b.i.d.  5. Metoclopramide  5 mg p.o. b.i.d.  6. Benztropine 2 mg p.o. t.i.d.  7. Amitiza 24 mcg b.i.d.  8. Zyprexa 20 mg p.o. nightly.  9. Baclofen 10 mg p.o. nightly.  10.Meclizine 25 mg p.o. daily.   PHYSICAL EXAMINATION:  GENERAL:  A well-developed and well-nourished  white male, currently in no apparent distress.  VITAL SIGNS:  Blood pressure 130/90, heart rate 105, temperature 99.5,  and respiratory rate 22.  HEENT:  The patient has a well-demarcated farmer's tan with erythema,  but no blistering.  The patient's pupils are equal and reactive to light  and accommodation.  Extraocular muscles appear to be intact.  CARDIOVASCULAR:  He has a regular rate and rhythm without murmurs, rubs,  or gallops.  PULMONARY:  Lungs are clear to auscultation bilaterally.  ABDOMEN:  Soft, nontender, and slightly distended.  Bowel sounds are not  present.  EXTREMITIES:  He is status post bilateral AKA's.  NEUROLOGIC:  Cranial nerves II-XII are grossly intact.  He has no focal  neurological deficits.   STUDIES:  1. 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  2. Chest x-ray shows gas in the stomach, but no acute cardiopulmonary      disease.   LABORATORY DATA:  White count 15.2, H and H 16.8 and 48, MCV 99,  platelets 259.  Sodium 139, potassium 4.8, chloride 101, bicarb 19, BUN  9, creatinine 0.6, and glucose 72.   ASSESSMENT AND PLAN:  1. Recurrent ileus.  The patient has no evidence of strangulation.  We      will make the patient n.p.o. except for ice chips, place G-tube,      continue IV fluids, and if the patient does show signs of      strangulation, consider either surgical consult or Gastrografin.  2. Cerebral palsy and mental retardation, currently stable.  3. Diabetes mellitus, type 2.  The patient is diet controlled, does      not require treatment.  4. Gastroesophageal reflux disease.  Continue proton pump inhibitor,      albeit IV.  5. Peripheral vascular disease, currently stable.  6.  Depression.  Hold selective serotonin reuptake inhibitor and all      p.o. medications.  7. Anxiety.  Continue to give Ativan p.r.n. as well as Zyprexa Zydis.   DISPOSITION:  The patient is full code.  H and P was constructed by  reviewing past medical history, conferring with emergency medical room  physician, and reviewing the emergency medical record.   TIME SPENT:  1 hour.      Renee Ramus, MD  Electronically Signed     JF/MEDQ  D:  10/04/2008  T:  10/05/2008  Job:  [id number]   cc:   Albertina Senegal, MD

## 2010-11-13 NOTE — Consult Note (Signed)
[name], [name] NO.:  [id number]   MEDICAL RECORD NO.:  [id number]          PATIENT TYPE:  EMS   LOCATION:  MAJO                         FACILITY:  [facility]   PHYSICIAN:  Nelda Bucks, MD DATE OF BIRTH:  [birth year]   DATE OF CONSULTATION:  03/30/2008  DATE OF DISCHARGE:                                 CONSULTATION   REASON FOR CONSULTATION:  Fever/sepsis.   HISTORY OF PRESENT ILLNESS:  This is a pleasant 45 year old male patient  with known history of cerebral palsy who lives in a group home.  He  presents today on March 30, 2008 reporting sudden onset of fever,  first noted on March 29, 2008 with associated chills.  Denied cough,  shortness of breath, did have some associated nausea and abdominal  discomfort with fever.  He has had mild increased abdominal distention,  reports no complaint of constipation, no changes in urinary patterns,  denies dysuria, hesitancy, or incontinence.  Denies any recent sick  exposures.  Presented today without any other significant pertinent  positives as associated above.  He did have on presentation a fever of  greater than 103, with elevated white blood cell count.  Because of  abdominal complaints, he had a CT abdomen obtained which showed gaseous  distention, and the significant fecal impaction.  The Pulmonary Critical  Care Team was asked to evaluate [name] on the basis of fever, signs  and symptoms of sepsis, and concern for progression of hypotension.   PAST MEDICAL HISTORY:  1. Cerebral palsy.  2. Anxiety.  3. Depression.  4. Diabetes type 2.  5. Bilateral AKAs.  6. Gastroesophageal reflux disease.   ALLERGIES:  No known drug allergies.   HOME MEDICATIONS:  Baclofen, benztropine, fluoxetine, lactulose,  metoclopramide, omeprazole, Singulair, Zyprexa, and vitamin C.   SOCIAL HISTORY:  He is a nonsmoker, has a history of EtOH abuse, but not  drinking at current group home.  He currently lives  at [facility].  He is mobile with an Passenger transport manager.   FAMILY HISTORY:  Not applicable.   REVIEW OF SYSTEMS:  Per HPI.   PHYSICAL EXAMINATION:  VITAL SIGNS:  T-max 103.3, heart rate 119 normal  sinus/sinus tach, blood pressure 100/60, respirations 20, and  saturations 98% on room air.  GENERAL:  No acute distress.  HEENT:  Normocephalic.  NECK:  Large, neck veins are flat, and completely collapsible on  ultrasound exam prior to triple-lumen insertion.  PULMONARY:  Clear to auscultation.  CARDIAC:  Regular rate and rhythm, tachy.  No murmurs.  EXTREMITIES:  Notable for bilateral AKA, they are warm, otherwise  unremarkable.  ABDOMEN:  Distended, tympanic percussion, tinkling hyperactive bowel  sounds noted.  GU:  Has a Foley catheter in place.  NEUROLOGIC:  Intact.   LABORATORY DATA:  White blood cell count 19, hemoglobin 16.1, platelet  count 226, neutrophil count 95%, and granulocytes 18.  Sodium 136,  potassium 4.3, chloride 102, CO2 24, BUN 14, creatinine 0.9, glucose  110, AST 35, ALT 35, and lipase 19.  Fecal occult blood negative.  Urinalysis  negative.  Lactic acid 4.6, now down to 3.6.  Urinalysis  negative.  Chest x-ray clear.  CT film of the abdomen demonstrating  gaseous distention, fecal impaction.  CT abdomen/pelvis, large amount of  stools in the rectum and sigmoid colon, bilateral inguinal nodes noted.   IMPRESSION AND PLAN:  1. Systemic inflammatory response syndrome/sepsis.  Suspect abdominal      source, would consider highly bacterial translocation from gut in      the setting of profound constipation.  Additionally, it could be      certainly complicated by volume depletion in the setting of fever.      Plan at this point is to follow up on culture data, empirically      cover with mono therapy Zosyn.  Agree with current admission.  Plan      to move on to step-down setting.  A triple-lumen catheter has been      placed, we will transduce CVP,  and aggressively hydrate.  Should he      need IV pressors, we will transfer to Intensive Care in modified      goal-directed therapy.  Hold p.o. meds as indicated by primary care      team.  Again, should he decompensate at this point, CT of abdomen      was done without contrast, should we notice further clinical      decompensation, specifically hypotension/signs and symptoms      consistent with shock, would again transfer to ICU, as well as      obtain CT abdomen and pelvis with contrast to further evaluate for      evidence of ischemia.  2. Constipation and fecal impaction.  Plan for this is to provide      laxatives, digital disimpaction, and followup abdominal film in the      morning.  3. Lactic acidosis with normal serum bicarb.  Plan for this is to      continue IV hydration, recheck lactic acid, his lactic acid is      improving.  Be sure there are not any current medication problems.  4. Diabetes type 2.  Plan for this is sliding scale insulin.  5. Gastroesophageal reflux disease.  Plan for proton pump inhibitor.   BEST PRACTICE:  Agree with subcu heparin, and PPI.      Zenia Resides, NP      Nelda Bucks, MD  Electronically Signed    PB/MEDQ  D:  03/30/2008  T:  03/30/2008  Job:  [id number]

## 2010-11-13 NOTE — Discharge Summary (Signed)
[name], [name]                ACCOUNT NO.:  [id number]   MEDICAL RECORD NO.:  [id number]          PATIENT TYPE:  INP   LOCATION:  1430                         [facility]ACILITY[facility]  [facility]   PHYSICIAN:  Hillery Aldo, M.D.   DATE OF BIRTH:  Nov 12, 1965   DATE OF ADMISSION:  03/30/2008  DATE OF DISCHARGE:  04/08/2008                               DISCHARGE SUMMARY   PRIMARY CARE PHYSICIAN:  Dr. Julius Bowels in [place], [place].   DISCHARGE SUMMARY ADDENDUM:  For complete list of the discharge  diagnoses, consultants, procedures and diagnostic studies, allergies,  chief complaint, history of present illness, and hospital course through  April 05, 2008, please see the previously dictated discharge summary  done by Dr. Ashley Royalty.   DISCHARGE MEDICATIONS:  1. Doxycycline 100 mg b.i.d. x2 weeks.  2. Baclofen 10 mg every night.  3. Benztropine 2 mg t.i.d.  4. Fluoxetine 40 mg daily.  5. Lactulose 30 mL b.i.d.  6. Reglan 5 mg b.i.d.  7. Singulair 10 mg every night.  8. Zyprexa 20 mg every night.  9. Vitamin C 500 mg daily.  10.Clotrimizole 1% cream topically twice daily.  11.Ketoconazole 2% cream topically twice daily.  12.Mucinex 600 CR every 12 hours.  13.Omeprazole 20 mg b.i.d.  14.Pataday 0.2% 1 drop to affected eyes daily.  15.Polyethylene glycol 3350 powder powder 2 caps twice daily, decrease      to daily for diarrhea.  16.Valium 5 mg once daily as needed.  17.Vicodin 5/500 1 to 2 every 4 to 6 hours as needed for pain.   ADDITIONAL DIAGNOSTIC STUDIES:  1. MRI on April 05, 2008, showed diffuse subcutaneous edema      consistent with cellulitis and mild myositis involving the vastus      lateralis muscle.  No soft tissue abscess or underlying      osteomyelitis.  2. KUB on April 07, 2008, showed findings most consistent with a      colonic ileus with no significant changes.   DISCHARGE LABORATORY VALUES:  Sodium was 139, potassium 3.6, chloride  106, bicarb 26, BUN 4,  creatinine 0.58, glucose 94.  White blood cell  count was 10.3, hemoglobin 11.1, hematocrit 33.2, platelets 400.   HOSPITAL COURSE:  1. Left hip cellulitis with systemic inflammatory response syndrome:      The patient has now completed 9 days of therapy with IV Zosyn and      vancomycin.  The infectious disease consultant, Dr. Ninetta Lights,      recommended an additional 2 weeks of therapy with p.o. doxycycline.      There was no evidence of underlying osteomyelitis based on      diagnostic imaging.  At this point, the patient's infection will be      treated with p.o. antibiotics for an additional 2 weeks, and he      should follow up with his primary care physician in 1-2 weeks for      further evaluation and assessment to ensure that his cellulitis has      resolved.  2. Colonic ileus:  The patient developed an  ileus during the course of      his hospital stay.  This has resolved clinically.  The patient is      having frequent bowel movements, and his abdominal distention has      improved.  He is passing flatus.  He has no nausea or vomiting.  At      this point, due to his frequent stools, we are reducing his      lactulose and Reglan to his preadmission dosages.  3. Cerebral palsy:  The patient is stable and will be continued on his      preadmission dose of baclofen to help with muscle spasm.  4. Depression/anxiety:  The patient was maintained on his usual      psychotropic medications.  His mood was level and his affect was      bright.  5. Gastroesophageal reflux disease:  The patient was maintained on      proton pump inhibitor therapy while hospitalized.  6. 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   DISPOSITION:  The patient is medically stable for discharge.  He did  have a temperature on the day of discharge and will have the central  line removed in case this is the  source.  Nevertheless, clinically, the  patient is asymptomatic, and on visual inspection, he has decreased  erythema and warmth to the cellulitic area on his left hip.  He should  follow up with his primary care physician, Dr. Julius Bowels, in 1-2 weeks.      Hillery Aldo, M.D.  Electronically Signed     CR/MEDQ  D:  04/08/2008  T:  04/08/2008  Job:  [id number]   cc:   Julius Bowels, M.D.  [place]

## 2010-11-13 NOTE — H&P (Signed)
[name], [name]                ACCOUNT NO.:  [id number]   MEDICAL RECORD NO.:  [id number]          PATIENT TYPE:  EMS   LOCATION:  MAJO                         FACILITY:  [facility]   PHYSICIAN:  Lucita Ferrara, MD         DATE OF BIRTH:  1966/01/09   DATE OF ADMISSION:  03/29/2008  DATE OF DISCHARGE:                              HISTORY & PHYSICAL   CHIEF COMPLAINT:  Fever.   The patient is a 45 year old with cerebral palsy and bilateral AKA who  presented with fevers of 103 from skilled nursing facility.  The patient  denies any cough, urinary frequency, urgency or burning.  Also denies  any skin breakdown, decubitus ulcerations.  The patient has been getting  leg cramps, generalized body aches and sore throat.  In the emergency  room, he had a full workup with negative results as far as UA, chest x-  ray and rapid strep test.  The patient also had a rectal exam with no  signs of infection or mucus.  The patient denies any sick contacts.  Otherwise, 12 point review of systems are negative.   PAST MEDICAL HISTORY:  1. Diabetes.  2. Anxiety.  3. Cerebral palsy.  4. Depression.  5. Gastroesophageal reflux disease.  6. Status post AKA.   PAST SURGICAL HISTORY:  Bilateral AKA.   SOCIAL HISTORY:  Denies drugs, alcohol or tobacco.  Lives in skilled  nursing facility.   ALLERGIES:  NO KNOWN DRUG ALLERGIES.   MEDICATIONS:  1. Baclofen 10 mg nightly.  2. Benztropine 2 mg three times a day.  3. Fluoxetine 40 mg once daily.  4. Lactulose 1 ounce twice daily.  5. Metoclopramide 5 mg b.i.d.  6. Omeprazole 20 mg b.i.d.  7. Singulair 10 mg nightly.  8. Zyprexa 20 mg nightly.  9. Vitamin C.   REVIEW OF SYSTEMS:  As per HPI, otherwise negative.   PHYSICAL EXAMINATION:  GENERAL:  The patient is in acute distress.  VITAL SIGNS:  Blood pressure is 105/66, pulse 130, respirations 20,  temperature 103.4.  HEENT:  Normocephalic, atraumatic.  Sclerae anicteric.  NECK:  Supple.  No JVD, no  carotid bruits.  CARDIOVASCULAR:  S1-S2.  Regular rhythm.  The patient is tachycardic.  LUNGS:  Clear to auscultation bilaterally.  No rhonchi, rales or  wheezes.  ABDOMEN:  Soft, nontender, nondistended.  Positive bowel sounds.  NEURO:  The patient is alert and oriented x3.  Cranial nerves II through  XII grossly intact.   EKG shows nonspecific ST-T wave changes and sinus tachycardia.  Lactic  acid 3.9.  Group A Streptococcus is negative.  Chest x-ray shows no  acute cardiopulmonary disease, just gaseous distention of the left upper  quadrant.  Abdominal x-ray shows large fecal burden in the right colon  with stool ball in the rectosigmoid, gaseous distention of the colon in  the left upper quadrant.  No free air or acute abnormality.  Urinalysis  negative for leukocytes and negative for nitrites.  Lactic acid 4.6.  Fecal occult blood negative.  Magnesium 1.7, lipase 19.  Basic metabolic  panel within normal limits.  Acetone negative.  PTT 24, INR 1.0.  White  count 19, hemoglobin 16.1, hematocrit 45.9, MCV 96.8, neutrophils 226.  CT scan of the abdomen is pending.   ASSESSMENT/PLAN:  The patient is a 45 year old with cerebral palsy and  bilateral above-knee-amputations with;  1. Fever of unknown origin.  2. Sepsis with positive indicators of increased lactate, hypotension,      white count of 19, yet his urinalysis is negative.  Chest x-ray      negative and no decubitus or skin breakdown or other infections in      his extremities.  3. Hypotension.  4. Cerebral palsy.   DISCUSSION AND PLAN:  Will go ahead and admit the patient to the step-  down unit.  Will initiate sepsis protocol.  Panculture, IV fluids.  Will  get coags.  If the patient is not responding to IV fluids, will proceed  with pressors.  IV antibiotics broad-spectrum for now with vancomycin,  Zosyn and Levaquin.  CT scan of the abdomen and pelvis with and without  IV and oral contrast given chest x-ray and KUB  findings.  Foley  catheter.  Strict Is and Os.  Watch renal function.  DVT prophylaxis  with Lovenox and GI prophylaxis with Protonix.  As far as the rest of  his medications, will continue his lactulose for his constipation,  Zyprexa prior to sleep and baclofen for muscle spasm.  The rest of plans  are dependent on his progress.      Lucita Ferrara, MD  Electronically Signed     RR/MEDQ  D:  03/30/2008  T:  03/30/2008  Job:  [id number]

## 2010-11-13 NOTE — Consult Note (Signed)
[name], [name]                ACCOUNT NO.:  [id number]   MEDICAL RECORD NO.:  [id number]          PATIENT TYPE:  INP   LOCATION:  3032                         FACILITY:  [facility]   PHYSICIAN:  Anselm Pancoast. Zachery Dakins, M.D.DATE OF BIRTH:  March 02, 1966   DATE OF CONSULTATION:  10/06/2008  DATE OF DISCHARGE:                                 CONSULTATION   REQUESTING PHYSICIAN:  Reather Littler, MD   CONSULTING GASTROENTEROLOGIST:  Anselmo Rod, MD   PRIMARY GASTROENTEROLOGIST:  Jordan Hawks. Elnoria Howard, MD   PRIMARY CARE PHYSICIAN:  Albertina Senegal, MD   REASON FOR CONSULTATION:  Progressive colonic ileus.   HISTORY OF PRESENT ILLNESS:  [name] is a 45 year old male patient  with known history of CP nonambulatory due to prior bilateral AKA from  peripheral vascular disease.  He was admitted on October 04, 2008 with  nausea and vomiting and low-grade fevers and abdominal pain.  He was  found to have markedly distended.  Abdomen x-ray revealed significant  ileus and on the left with a dilated air-filled colon with a right  colonic fecal burden.  The patient is subsequently been hydrated and NG  tube placed.  He is now without abdominal pain but is noting increased  abdominal distention.  X-ray today confirms there is more air in the  left colon and more distended.  Dr. Lucianne Muss, the attending physician of  note for Internal Medicine requested a GI consult.  He spoke with Dr.  Loreta Ave over the phone.  She reviewed the x-ray but felt that this was a  surgical issue and recommended consulting Surgery.   REVIEW OF SYSTEMS:  As per the history of present illness.  The patient  reports he has had some sort of trouble with chronic difficulty passing  bowel movements for a long time which obviously has been exacerbated by  his dysmobility and use of multiple neuropsychiatric meds.   PAST MEDICAL HISTORY:  1. Cerebral palsy and reported developmental disability.  2. GERD.  Previous esophageal dilatation.  3.  Borderline diabetes.  4. Peripheral vascular disease.  5. Depression and anxiety.  6. Chronic obstipation with recurrent ileus.   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   SOCIAL HISTORY:  He is currently a resident of a skilled nurse facility  in [facility] area.  He does not utilize alcohol or tobacco currently  and there is report in some of the old medical records that he used to  drink heavily, uncertain if he ever smoked.   FAMILY HISTORY:  Noncontributory.   ALLERGIES:  NKDA.   MEDICATIONS AT HOME:  Diazepam, fluoxetine, tizanidine, hydrochloride,  omeprazole, metoclopramide, benztropine, Amitiza, Zyprexa, baclofen, and  meclizine.  Since admission, the patient has been placed on Protonix IV,  p.r.n. IV Ativan and Zofran.  This morning he has been given Kayexalate  and lactulose to help with obstipation as well as help with resolution  of the hyperkalemia.  In addition, he has been given Lasix 20 mg IV type  x1 to  help bring the potassium down.   PHYSICAL EXAMINATION:  GENERAL:  Pleasant male patient reporting  decreasing abdominal pain since NG tube has been inserted.  No passage  of flatus since arrival to the hospital.  VITAL SIGNS:  T-max 99.6, BP 103/70, pulse 94 and regular, and  respiration 18.  PSYCH:  Affect is appropriate to current situation.  The patient is  alert and oriented to x3.  NEUROLOGIC:  Cranial nerves II-XII are grossly intact.  He does have  cerebral palsies, so therefore has spastic upper extremity movement.  On  exam, he does have some flexion contractures of the arms but he is able  to feed himself and do other things with his upper extremities.  His  EOMs are basically intact.  He seems to have a preferred rightward gait  bilaterally but no definite nystagmus.  EYES:  Sclerae are noninjected, nonicteric.  EARS, NOSE, and THROAT:  Ears are symmetrical.  No  otorrhea.  Nose is  midline.  No rhinorrhea or mucous.  Oral mucous membranes are pink and  moist.  CHEST:  Bilateral lung sounds are clear to auscultation.  Respiratory  effort is nonlabored.  CARDIOVASCULAR:  Heart sounds S1-S2.  No rubs, murmurs, or gallops.  No  JVD.  No peripheral edema.  ABDOMEN:  Obese and soft, nontender, and distended.  No bowel sounds are  auscultated.  NG is in place with dark bilious returns.  EXTREMITIES:  Symmetrical in appearance.  He has the upper extremity  flexion contractures as mentioned due to CP and bilateral AKAs, and he  is nonambulatory.   LABORATORY DATA:  Sodium today is 141, potassium 6.0, chloride 115, CO2  11, glucose 66, BUN 5, and creatinine 0.59.  White count 12,200,  hemoglobin 13.4, and platelets 242,000.   DIAGNOSTICS:  X-ray is noted.  In review of previous x-rays in 2008, the  patient had a massive right-sided fecal burden with air-filled redundant  transverse colon that looped down towards the pelvis as well as some air  and feces and contrast in left colon and is ectatic where the left colon  appeared to be in normal caliber.   IMPRESSION:  1. 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  2. Hyperkalemia and acidosis, relative volume depletion.  No abdominal      pain, history of this is recurrent in the past previous admissions.  3. Cerebral palsy and anxiety and depression, on multiple      neuropsychogenic medications.  4. 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   PLAN:  1. I have discussed this the patient with Dr. Zachery Dakins.  He reviewed      the x-rays and the patient's laboratory work.  At this time, he      feels that the patient would benefit from GI consultation for      colonic decompression.  He has subsequently called Dr. Carman Ching with [facility] who will come by and evaluate the patient.  2. Feel that the patient's combined nonambulatory state with  need for      neuropsychogenic meds because of anxiety, depression, and cerebral      palsy also contributing chronic obstipation.  Note, that the      patient was not on any regular stool softeners or MiraLax.  Prior      to admission, this may need to be instituted to prevent this in the  future or minimize symptoms.  At this time, we would continue bowel      rest and hydration and NG tube.  Again, I see no free air and the      patient .  The patient is not having abdominal pain, so at this      time I do not feel      the hyperkalemia and acidosis is coming from an anemic or bed bowel      type of issue which seems to be more of volume and metabolic      issues.  3. Internal Medicine.  Otherwise is managing the above-mentioned      electrolyte abnormalities.      Allison L. Rennis Harding, N.P.      Anselm Pancoast. Zachery Dakins, M.D.  Electronically Signed    ALE/MEDQ  D:  10/06/2008  T:  10/07/2008  Job:  [id number]   cc:   Reather Littler, M.D.  James L. Malon Kindle., M.D.  Albertina Senegal

## 2010-11-13 NOTE — Discharge Summary (Signed)
NAMEREEGAN, [name]                ACCOUNT NO.:  [id number]   MEDICAL RECORD NO.:  [id number]          PATIENT TYPE:  INP   LOCATION:  3032                         FACILITY:  [facility]   PHYSICIAN:  Gardiner Barefoot, MD    DATE OF BIRTH:  03-08-1966   DATE OF ADMISSION:  10/04/2008  DATE OF DISCHARGE:                               DISCHARGE SUMMARY   PRIMARY CARE PHYSICIAN:  Dr. Albertina Senegal at [facility].   This is a preliminary discharge summary pending discharge time.   HISTORY OF PRESENT ILLNESS:  Please see previously dictated history and  physical from October 04, 2008.  Briefly, a 45 year old male with multiple  admissions for recurrent acute-on-chronic ileus who comes with nausea,  vomiting, and low-grade fever.  He had some worsening symptoms over  several days, and x-ray showed an ileus.  Although initially, he had a  bowel movement in the emergency room.  He was still with significant  ileus and admitted for management.   DISCHARGE DIAGNOSES:  1. Acute-on-chronic ileus, resolving.  2. Cerebral palsy.  3. Mental retardation.  4. 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  5. Diabetes.  6. Gastroesophageal reflux disease.  7. Anxiety.  8. Peripheral vascular disease.  9. Depression.  10.History of esophageal dilatation.   MEDICATIONS AT DISCHARGE:  These are pending at this time; however, his  benztropine has been held during his hospitalization that can exacerbate  it.  He probably would benefit from abstaining from benztropine in the  future.   HOSPITAL COURSE BY PROBLEMS:  1. Ileus.  The patient was started on NG tube and remained n.p.o. in      the beginning and for the first 24-48 hours, the patient began to      improve some.  However about that time, his gallstones start to      decrease and his abdomen began to become increasingly distended and      consultation was sought from Gastroenterology and Surgery.  The      patient  was managed with stool softeners, bowel rest, and NG tube      placed and also required rectal tube.  The patient for several days      continued to have significant distention and decreased bowel sounds      and other signs of ileus.  At this time, however, his ileus is soft      and he is feeling markedly better and his diet is being advanced.      He has done well since the NG tube was taken out.  He also has      remained afebrile during the hospitalization.  Surgery, who did see      the patient, did suggest in future he may need consideration for      colectomy; however, certainly this would be consider last resort as      this is certainly a major change.  At this time, he is progressing      nicely and will continue to watch.  2. Electrolyte abnormalities.  The patient did have some issues with      his electrolytes.  However, at this time, his electrolytes remained      stable.  3. Diabetes.  The patient did have some elevation of his blood sugars;      however, typically he is on diet control as an outpatient.  He can      continue with that.   DISPOSITION:  Discharge is pending at this time and will depend on his  continued improvement.      Gardiner Barefoot, MD  Electronically Signed     RWC/MEDQ  D:  10/11/2008  T:  10/12/2008  Job:  [id number]

## 2010-11-13 NOTE — Consult Note (Signed)
[name], [name]                ACCOUNT NO.:  [id number]   MEDICAL RECORD NO.:  [id number]          PATIENT TYPE:  INP   LOCATION:  1430                         [facility]ACILITY:  [facility]   PHYSICIAN:  Jordan Hawks. Elnoria Howard, MD    DATE OF BIRTH:  [birth year]   DATE OF CONSULTATION:  04/05/2008  DATE OF DISCHARGE:                                 CONSULTATION   REASON FOR CONSULTATION:  Ileus.   HISTORY OF PRESENT ILLNESS:  This is a 45 year old gentleman with  cerebral palsy, contracted upper extremities and status post bilateral  above the knee amputation, diabetes, reflux, and depression, who was  admitted to the hospital originally with fevers. He has presented with a  sepsis-like picture and was treated aggressively. At that time he  subsequently improved. However, over his hospitalization, the patient  developed an ileus. His abdomen is distended with p.o. intake and  despite conservative measures, this still remains a problem. The patient  also continues to have fevers, which is most likely from his cellulitis.  The patient reports that his abdomen is not distended, although he has a  history of stool, he is still having a significant amount of stool and  distended as a result in the past.   PAST MEDICAL/SURGICAL HISTORY:  Is as stated above.   FAMILY HISTORY:  Noncontributory.   SOCIAL HISTORY:  The patient lives in a skilled nursing facility.   REVIEW OF SYSTEMS:  As above in the history of present illness.  Otherwise, negative.   MEDICATIONS:  1. Atrovent 0.5 mg inhaler b.i.d.  2. Lactulose 30 mg 1 p.o. q.i.d.  3. Cogentin 2 mg 1 p.o. t.i.d.  4. Lioresal 10 mg 1 p.o. q.h.s.  5. Mylicon 80 mg 1 p.o. q.i.d.  6. Sliding scale insulin.  7. Protonix 40 mg p.o. daily.  8. Prozac 40 mg p.o. daily.  9. Reglan 10 mg 1 p.o. q.h.s.  10.Singulair 10 mg 1 p.o. q.h.s.  11.Vancomycin 1 gram q.8 hours.  12.Ventolin 2. 5 mg inhaler b.i.d.  13.Vitamin C.  14.Zosyn 3.375 grams IV q.8 hours.  15.Zyprexa 20 mg p.o. q.h.s.  16.Dulcolax 10 mg p.r.n. suppository.   PHYSICAL EXAMINATION:  VITAL SIGNS:  Blood pressure 116/75, heart rate  90, respiratory rate 22, temperature 98.3, pulse ox 95% on room air.  GENERAL:  No acute distress. Alert and oriented.  HEENT:  Normocephalic and atraumatic. Extraocular muscles intact.  NECK:  Supple with no lymphadenopathy.  LUNGS:  Clear to auscultation bilaterally.  CARDIOVASCULAR:  Regular rate and rhythm.  ABDOMEN:  Distended tympanic. Positive bowel sounds.  EXTREMITIES:  He has contracted upper extremities, status post bilateral  above the knee amputation.   LABORATORY DATA:  White blood cell count 7.4. Hemoglobin 10.6. Platelets  209,000. Sodium 140, potassium 3.2, chloride 112, CO2 22, BUN 1,  creatinine 2.5, glucose 93.   IMPRESSION:  1. ILEUS.  2. HYPOKALEMIA.  3. CEREBRAL PALSY.  4. DIABETES.  5. CELLULITIS.   RECOMMENDATIONS:  Unfortunately, I cannot make any significant new  recommendations. Recurrent therapies of correcting his electrolytes is  recommended. The patient  is a bilateral lower extremity amputee and  therefore, it is not feasible for him to ambulate to help with his  ileus. He does report feeling a little bit better at this time with  insertion of the rectal tube but this will not ultimately resolve his  symptoms. Hopefully, with picture of time, this will improve. Certainly  if there is any evidence of Ogilvie, neostigmine can be tried.  Certainly, if the patient is passing some flatus as well as bowel  movement and able to tolerate p.o. without significant difficulty  despite the persistent abdominal distention, he can be discharged in  that type of state. I will continue to follow at this time.      Jordan Hawks Elnoria Howard, MD  Electronically Signed     PDH/MEDQ  D:  04/05/2008  T:  04/05/2008  Job:  [id number]

## 2010-11-13 NOTE — Discharge Summary (Signed)
[name], [name]                ACCOUNT NO.:  [id number]   MEDICAL RECORD NO.:  [id number]          PATIENT TYPE:  INP   LOCATION:  5504                         FACILITY:  [facility]   PHYSICIAN:  Herbie Saxon, MDDATE OF BIRTH:  06-01-1966   DATE OF ADMISSION:  07/01/2008  DATE OF DISCHARGE:  07/05/2008                               DISCHARGE SUMMARY   DISCHARGE DIAGNOSES:  1. Colonic ileus, resolved.  2. Hyperkalemia repleted.  3. Depression.  4. Anxiety.  5. History of gastroesophageal reflux disease.  6. History of esophageal diltation x3.  7. Peripheral vascular disease, status post bilateral above-knee      amputation.  8. History of cerebral palsy.  9. Borderline diabetes.   RADIOLOGY:  The abdominal x-ray of July 03, 2008, shows overall much  improved bowel gas pattern with significant decrease in the amount of  air-distended bowel.  Abdominal x-ray on July 01, 2008, shows diffused  of significant colonic ileus.   HOSPITAL COURSE:  This 45 year old male, patient of Dr. Julius Bowels in [facility] presented to the emergency room with abdominal pain, nausea, and  vomiting, which has been worsening in the last 3 days still with  constipation in the last 3 days.  At presentation, he was noticed to  have colonic ileus based on bowel arrest and NG tube decompression.  I  was also started on IV fluid hydration and antiemetics for p.r.n.  vomiting.  Hyperkalemia was supplemented and p.o. meds were held.  Repeat abdominal x-ray 48-hours did showed significant improvement.  The  patient was started on clear liquid diet and this was in advanced.  He  is tolerating regular diet.  He is being discharged back home in stable  condition to follow up with his primary care physician by Dr. Julius Bowels at  [facility] in the next 5-7 days.   DIET:  1800-calorie ADA.   ACTIVITY:  Increased slowly as tolerated is to be further assistance.   DISCHARGE MEDICATIONS:  1. Senokot-S 1  tablet at bedtime as needed.  2. Tylenol 500 mg q.6 h. p.r.n.  3. Zofran 2 mg q.6 h. p.r.n.   OTHER MEDICATIONS:  1. Tizanidine 2 mg q.6 h.  2. Reglan 5 mg a.c. and h.s.  3. Benztropine 2 mg 2 times a day.  4. MiraLax 17 g 3 times a day as needed.  5. Diastat 5 mg daily.  6. Zofran 2 mg daily.  7. Vitamin C 500 mg daily.  8. Prilosec 20 mg b.i.d.  9. Ketoconazole cream topically twice a day.  10.Zyprexa 10 mg a day at bedtime.  11.Baclofen 5 mg at bedtime.  12.Baclofen 10 mg at bedtime.   PHYSICAL EXAMINATION:  GENERAL:  On examination today, he is an elderly  man not in acute respiratory distress, comfortable lying in bed.  He is  alert and oriented x2.  VITAL SIGNS:  Temperature is 98, pulse 82, respiratory rate is 20 , and  blood pressure 120/80.  HEENT:  Pupils equal and reactive to light and accommodation.  NECK:  Supple.  CARDIAC:  Heart sounds 1 and  2.  Regular rate and rhythm.  No murmur,  rubs, or gallops.  CHEST:  Clinically clear.  No rales or rhonchi.  ABDOMEN:  Benign.  Bowel sounds present.  NEUROLOGIC:  He is alert.  He has bilateral above-knee amputation.  No  edema.   LABORATORY RESULTS:  Chemistries shows a sodium of 137, potassium 4.1,  chloride 106, bicarbonate 22, glucose 100, BUN 2, and creatinine 0.4.  Complete blood count, WBC is 9, hematocrit 49, and platelet count is  227.   DISCHARGE TIME:  Greater than 30 minutes.      Herbie Saxon, MD  Electronically Signed     MIO/MEDQ  D:  07/05/2008  T:  07/06/2008  Job:  [id number]   cc:   Dr. Julius Bowels

## 2010-11-13 NOTE — Discharge Summary (Signed)
NAMEGEORGIO, [name]                ACCOUNT NO.:  [id number]   MEDICAL RECORD NO.:  [id number]          PATIENT TYPE:  INP   LOCATION:  5121                         FACILITY:  [facility]   PHYSICIAN:  Alvester Morin, M.D.  DATE OF BIRTH:  [birth year]   DATE OF ADMISSION:  05/20/2008  DATE OF DISCHARGE:  05/25/2008                               DISCHARGE SUMMARY   DISCHARGE DIAGNOSES:  1. Ileus.  2. Depression.  3. Anxiety.  4. History of gastroesophageal reflux disease.  5. History esophageal dilatation x3.  6. Peripheral vascular disease, status post bilateral above-the-knee      amputation.  7. History of cerebral palsy.  8. Borderline diabetes.  9. Diabetes type 2.  10.History of left hip cellulitis with systemic inflammatory response      syndrome in October 2009.   DISCHARGE MEDICATIONS:  1. Baclofen 10 mg 1 tablet p.o. daily.  2. Clotrimazole 1% cream, antifungal cream, spread to area as needed.  3. Fluoxetine 40 mg 1 tablet p.o. daily.  4. Ketoconazole 2% cream, antifungal cream, spread to the area as      needed.  5. Lactulose 30 mL p.o. q.8 h.  6. Metoclopramide 10 mg 1 tablet p.o. in a.m. and in the evening 30      minutes before meals.  7. Omeprazole 20 mg 1 tablet p.o. b.i.d.  8. Phenergan 25 mg 1 tablet p.o. q.4 h. as needed for nausea.  9. Valium 5 mg 1 tablet p.o. q.3-4 h. as needed for anxiety.  10.Vitamin C 500 mg 1 tablet p.o. daily.  11.Zyprexa 20 mg 1 tablet p.o. daily.  12.Fleet Enema 133 mL per rectum as needed for constipation if the      patient does not have a bowel movement 2 days ago Fleet Enema.   CONDITION AT DISCHARGE:  The patient has responded well to treatment.  The patient will follow up with primary care physician, Dr. Julius Bowels in  December 2009 at 11 a.m. for further management.  The patient was told  to take Fleet Enema if he did not experience any bowel movements within  2 days of last bowel movement.  The patient was discharged to  assisted  living facility.   PROCEDURES:  Abdominal x-ray on May 20 2008.  Impression:  Colonic  distention with gas and stool consistent with ileus.   Chest x-ray.  Impression:  Limited study.  No acute infiltrates or  edema.  Bibasilar atelectasis.   CT of the abdomen with contrast.  Impression:  1. Very large amount of fecal matter and gas within the colon.  Could      be symptomatic.  2. Question esophagitis.   CT of the pelvis.  Impression:  No acute or primary findings in the  pelvis.   Chest x-ray on May 21, 2008.  Impression:  1. Right PICC line tip, distal superior vena cava/cavoatrial junction.  2. Nasogastric tube side hole just beyond gastroesophageal junction.   Abdominal x-ray.  Impression:  1. Nonspecific ileus.  2. No free air.  3. Large amount of fecal material in the left  colon.  4. Degenerative changes of the right hip.   HISTORY OF PRESENT ILLNESS:  A 45 year old man with past medical history  of chronic ileus, cerebral palsy, and diabetes who complains of  abdominal pain, nausea, and vomiting.  Abdominal pain started suddenly  at 12 a.m.  Pain has been constant, localized to right upper quadrant,  10/10 in intensity, associated with two episodes of vomiting.  One  episode of vomiting at 12:30 a.m. and the other at 8:30 a.m.  The  patient vomited fluid, no blood.  The patient states that the last bowel  movement was 1 week ago.  The patient got an enema yesterday, but it did  not help.  The patient did not eat lunch or breakfast.  The patient  denies fever, chills, diarrhea, dysuria, or hematochezia.  The patient  states that the pain is sharp and moved to the center of the abdomen.   PHYSICAL EXAMINATION:  VITAL SIGNS:  Temperature 98.6, blood pressure  117/81, pulse 93, respiratory rate 22, and oxygen saturation 95 on room  air.  GENERAL:  Mildly distressed due to pain.  HEENT:  Eyes:  Lateral nystagmus.  PERRLA.  ENT:  Nonerythematous   pharynx.  NECK:  Supple.  No JVD.  No carotid bruits.  No thyromegaly.  No masses.  RESPIRATORY:  Clear to auscultation bilaterally.  No wheezes, rhonchi,  or crackles.  CARDIOVASCULAR:  Regular rate and rhythm.  No murmurs, rubs, or gallops.  GASTROINTESTINAL:  Bowel sounds minimal to absent, soft, distended,  tender to palpation in the right quadrant.  No guarding, no rebound.  EXTREMITIES:  Bilateral amputation above the knee.  SKIN:  Nonerythematous and nonpurulent 0.5 cm vesicle in the right  inguinal area.  LYMPHATICS:  No lymphadenopathy.  NEUROLOGIC:  Alert and oriented x3, cranial nerves II through XII  intact, 0/5 strength in lower extremities, 2/5 strength in upper  extremities.   ADMISSION LABORATORIES:  Sodium 137, potassium 4.1, chloride 102, bicarb  25, BUN 10, creatinine 0.52, glucose 126, white blood cells 16.7,  hemoglobin 16.3, hematocrit 46.5, and platelets 277,   PROBLEMS:  1. Ileus most likely due to medications that the patient takes such as      Vicodin and benztropine.  These medications were stopped initially      early in the course of the hospitalization.  The patient was placed      n.p.o. and NG tube was also placed.  Nurse was asked to do rectal      stimulation and check for impaction and to give the patient mineral      oil enemas.  The patient was also started on lactulose.  The      patient initially did not respond to treatment and thus on second      day of admission, he was also started on erythromycin.  It was      discontinued because of no evidence of infection on chest x-ray or      urinalysis, then continued to also provide further treatment with      sorbitol and mineral oil enemas every 12 hours.  On day #3 of      hospitalization, the patient still has not had a bowel movement and      abdominal x-ray was ordered to rule out any perforation, which did      not show any evidence of perforation, but did show persistent      ileus.  On day  #4 of  hospitalization, the patient had a large bowel      movement.  At that time, we proceeded to clamp NG tube for possible      discontinuation, which was later done on that day.  The patient was      then started on a liquid diet.  D5 half normal saline was      discontinued and he was started on normal saline at that time,      which was being given through a PICC line because the patient was      hard to sedate.  The patient continued to have four large bowel      movements and potassium was appropriately replenished.  On day #5      of hospitalization, the patient was having normal bowel movements      and was thus considered to be medically stable to be discharged.      The patient will be discharged to assisted living facility.  The      patient will continue lactulose, sorbitol, Zofran, and Fleet Enema      as needed on an outpatient basis to prevent constipation.  The      patient will stop Vicodin and benztropine, which may have been the      culprits to ileus.  The patient will follow up with primary care      physician.  2. Hyponatremia which was most likely related to dehydration from      vomiting.  The patient was given aggressive IV fluids and sodium      normalized within the hospitalization and levels were closely      monitored throughout hospitalization without any further      disbalances.  3. Depression.  The patient was continued on home dose medication      throughout the course of the hospitalization.  The patient will      follow up with primary care physician for further management.   DISCHARGE VITALS:  Temperature 98.1, blood pressure 118/72, pulse 90,  respiratory rate 18, and oxygen saturation 96 on room air.   DISCHARGE LABORATORIES:  Sodium 139, potassium 3.8, chloride 108, bicarb  26, BUN less than 1, creatinine 0.49, glucose 124.  White blood cells  8.8, hemoglobin 15.6, hematocrit 40.2, and platelets 260.      Danne Harbor, MD   Electronically Signed      Alvester Morin, M.D.  Electronically Signed    RV/MEDQ  D:  08/05/2008  T:  08/06/2008  Job:  [id number]

## 2010-11-13 NOTE — H&P (Signed)
[name], [name]                ACCOUNT NO.:  [id number]   MEDICAL RECORD NO.:  [id number]          PATIENT TYPE:  INP   LOCATION:  5504                         FACILITY:  [facility]   PHYSICIAN:  Della Goo, M.D. DATE OF BIRTH:  [birth year]   DATE OF ADMISSION:  07/01/2008  DATE OF DISCHARGE:                              HISTORY & PHYSICAL   PRIMARY CARE PHYSICIAN:  Unassigned.  Dr. Vernell Morgans in [place], [place].   CHIEF COMPLAINT:  Abdominal pain, nausea, vomiting.   HISTORY OF PRESENT ILLNESS:  This is a 45 year old male with cerebral  palsy who is also a bilateral above-knee amputee, presenting to the  emergency department with complaints of nausea and vomiting and no bowel  movements for the past 3 days.  The patient reports having nausea for  the past few days but only vomiting once this a.m. and reported that the  emesis was orange in color.  He denies hematemesis.  The patient reports  having decreased p.o. intake as well and reports that his abdomen has  been becoming more distended and bloated.  He denies having any fevers,  chills, chest pain.  He does report abdominal pain when he changes  position.  The patient also was hospitalized approximately one month ago  with the same symptoms.   PAST MEDICAL HISTORY:  Significant for:  1. Cerebral palsy.  2. Depression.  3. Type 2 diabetes mellitus which is borderline by his report.  4. Gastroesophageal reflux disease.  5. Peripheral vascular disease status post bilateral above-knee      amputations.  6. History of anxiety.   PAST SURGICAL HISTORY:  History of esophageal dilatations x3.   His medications include baclofen, Benzatropine, fluoxetine, lactulose,  metoclopramide, omeprazole, Singulair, Zyprexa, and vitamin C.   SOCIAL HISTORY:  The patient is a nonsmoker, nondrinker.   FAMILY HISTORY:  Is noncontributory.   REVIEW OF SYSTEMS:  The patient denies having any chest pain or  shortness of breath.   Pertinent positives are mentioned above.  He  denies having any fevers, chills.  He does report having muscle  spasticity which is chronic.  He denies having any lightheadedness,  syncope or seizure activity.  He denies having night sweats.   PHYSICAL EXAMINATION FINDINGS:  This is a 45 year old, debilitated,  bilateral amputee male in discomfort but no acute distress.  VITAL SIGNS:  Temperature 98.2, blood pressure 112/81, heart rate 110  initially, respirations 35 initially - now 16, and O2 saturations 94-  97%.  HEENT EXAMINATION:  Normocephalic, atraumatic.  There is an NG tube  present draining from the right naris into suction at this time which is  draining brownish curd-like liquid.  Oropharynx is clear.  NECK:  Supple.  Full range of motion.  No thyromegaly, adenopathy,  jugular venous distention.  CARDIOVASCULAR:  Tachycardia.  No murmurs, gallops or rubs.  LUNGS:  Decreased breath sounds but clear.  ABDOMEN:  Decreased bowel sounds, positive distention.  Tympanic in all  4 quadrants.  No fluid wave present.  Nontender to palpation.  Unable to  palpate for hepatosplenomegaly secondary  to distention.  EXTREMITIES:  Bilateral amputations present.  No skin breakdown seen.   LABORATORY STUDIES:  White blood cell count 9, hemoglobin 16.9,  hematocrit 49.4, MCV 96.3, platelets 327, neutrophils 71%, lymphocytes  19%.  Sodium 138, potassium 3.4, chloride 105, bicarb 21, BUN 6,  creatinine 0.69, and glucose 99.  Acute abdominal series reveals diffuse  colonic ileus,  no free air.   ASSESSMENT:  A 45 year old male being admitted with:  1. Ileus.  2. Abdominal distention.  3. Nausea and vomiting secondary to #2 and #1.  4. Mild hypokalemia.  5. Cerebral palsy.   PLAN:  The patient will be admitted and placed on bowel rest.  The NG  tube will remain to low intermittent suction to help decompression.  Antiemetic therapy has been ordered along with IV fluids for maintenance   therapy.  His electrolytes will be corrected as needed.  The patient's  p.o. medications will be held for now.  The etiology of his ileus may be  secondary to his medications but also due to his medical conditions.  Further workup will ensue pending results of his clinical progression.  DVT and GI prophylaxis have been ordered.      Della Goo, M.D.  Electronically Signed     HJ/MEDQ  D:  07/02/2008  T:  07/02/2008  Job:  [id number]

## 2010-11-13 NOTE — Discharge Summary (Signed)
[name], [name]                ACCOUNT NO.:  [id number]   MEDICAL RECORD NO.:  [id number]          PATIENT TYPE:  INP   LOCATION:  1430                         [facility]ACILITY[facility]   PHYSICIAN:  Altha Harm, MDDATE OF BIRTH:  Aug 20, 1965   DATE OF ADMISSION:  03/30/2008  DATE OF DISCHARGE:                               DISCHARGE SUMMARY   DATE OF DISCHARGE.:  Not as yet determined.   FINAL DISCHARGE DIAGNOSES:  1. Ileus.  2. Cellulitis of the left hip  3. Diabetes type 2.  4. Sepsis resolved.  5. History of cerebral palsy.  6. Bilateral AKA.  7. History of depression.  8. History of gastroesophageal reflux disease.  9. History of anxiety.   DISCHARGE MEDICATIONS:  To be dictated at time of discharge.   CONSULTANTS:  1. Lacretia Leigh. Ninetta Lights, M.D., infectious disease.  2. Jordan Hawks. Elnoria Howard, MD. gastroenterology.  3. Nelda Bucks, MD, critical care for procedural consult.   PROCEDURE:  Central line placement.   DIAGNOSTIC STUDIES:  1. Abdominal x-ray 2-view -- which shows large fecal burden in the      right colon, with a food ball in the rectosigmoid.  Gaseous      distention of the colon and left upper quadrant.  No free air or      acute abnormality.  2.  Chest x-ray 2-view -- which shows no acute      cardiopulmonary disease.  2. Multiple repeat abdominal x-rays -- which demonstrate no      improvement with ileus.  3. CT of the pelvis and abdomen with contrast.  The CT of the abdomen      shows no acute intra-abdominal findings.  CT of the pelvis shows      mild edema, stranding in the left inguinal subcutaneous.   PENDING STUDIES:  MRI of the left hip.   ALLERGIES:  NO KNOWN DRUG ALLERGIES.   CODE STATUS:  Full code.   CHIEF COMPLAINT:  Fever.   HISTORY OF PRESENT ILLNESS:  Please refer to the H and P by Dr. Flonnie Overman  for details of the HPI.   HOSPITAL COURSE:  1. Fever:  The patient was admitted with fever, unknown entity.      Within 48 hours of the  patient being here, he developed extensive      redness on the left hip.  It appears that this was the source of      fever.  The patient was started on Zosyn and vancomycin was added.  2. The patient still continued to have high fevers and gentamicin was      added.  Subsequently infectious disease was consulted.  They      removed the gentamicin and the patient continues on Zosyn and      vancomycin.  Zosyn is day #7 and vancomycin is day #5.  3. Ileus:  The patient on admission had an ileus, despite Reglan,      Flexashield correction of abnormalities.  The patient still      continues to have an ileus.  Dr. Elnoria Howard from gastroenterology  was      consulted, and he will see the patient today.  4. Diabetes type 2:  The blood sugars were well controlled.  5. Cerebral Palsy:  Stable.  The patient has otherwise been stable      without any difficulty   This brings [place] up to date for his hospital course from March 30, 2008 through April 05, 2008.      Altha Harm, MD  Electronically Signed     MAM/MEDQ  D:  04/05/2008  T:  04/05/2008  Job:  [id number]

## 2010-11-13 NOTE — Consult Note (Signed)
NAMEMATHAN, DARROCH                ACCOUNT NO.:  [id number]   MEDICAL RECORD NO.:  [id number]          PATIENT TYPE:  INP   LOCATION:  3032                         FACILITY:  [facility]   PHYSICIAN:  James L. Randa Evens, M.D. DATE OF BIRTH:  Nov 02, 1965   DATE OF CONSULTATION:  10/06/2008  DATE OF DISCHARGE:                                 CONSULTATION   We were asked to see Mr. Rogelio Seen today in consultation by Dr. Zachery Dakins  of the [facility].  Mr. Everitt is an unassigned  patient, currently on [facility].   HISTORY OF PRESENT ILLNESS:  This is a 45 year old male with a history  of chronic constipation and chronic ileus.  The patient describes  vomiting for several days before coming to the hospital.  He denies any  abdominal pain.  He has had 3 stools in the last 24 hours.  These were  semisolid.  X-rays on e-chart indicate that this is a long-term  recurrent problem for this gentleman since at least 2008.  He denies any  blood in his emesis or stool.  He is on multiple psychotropic  medications at home, not on any opiates or anticholinergics that I can  see.   PAST MEDICAL HISTORY:  Significant for:  1. Cerebral palsy.  2. Mental retardation.  3. Type 2 diabetes, it is uncontrolled.  4. GERD.  5. Peripheral vascular disease.  He has had bilateral AKAs secondary      to this.  6. Anxiety and depression.  7. 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  8. He is status post esophageal dilation.   CURRENT MEDICATIONS:  Diazepam, fluoxetine, tizanidine, omeprazole,  Reglan, and benztropine, Amitiza, Zyprexa, baclofen, and meclizine.   He has no known drug allergies.   REVIEW OF SYSTEMS:  Significant for anorexia and vomiting.  No fever.  No palpitations.  No shortness of breath.   SOCIAL HISTORY:  Negative for drugs and alcohol.  He lives at home in an  apartment with an one-on-one caretaker.   FAMILY HISTORY:  Noncontributory in this case.   PHYSICAL  EXAMINATION:  GENERAL:  He is alert and oriented, pleasant to  speak with, slightly childlike but able to answer questions completely  and accurately.  VITAL SIGNS:  Temperature is 98.0, pulse 94, respirations 18, and blood  pressure is 103/70.  HEART:  Regular rate and rhythm with no obvious murmurs, rubs, or  gallops.  LUNGS:  Clear.  ABDOMEN:  Soft, slightly firmer on the right.  He has bowel sounds.  He  is nontender and nondistended.   LABORATORY DATA:  Hemoglobin 13.4, hematocrit 37.9, white count 12.2,  and platelets 242,000.  His potassium this morning was 6, he is  receiving Kayexalate and lactulose for this.  BUN 5 and creatinine 0.59.  Stool was guaiac positive.  X-ray done today shows large amount of stool  in the right colon, gas in the left colon, and cecum is at 10.5 cm in  diameter.  He has persistent dilations likely functional rather than  anatomical obstruction.   ASSESSMENT:  Dr. Carman Ching has  seen and examined the patient,  collected a history, and reviewed his chart.  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  We  will plan for Gastrografin enema today after his potassium is corrected,  hopefully this will help clean out some stool as well as elucidate any  possible masses in his colon.  Agree with NG tube.  Consider Flexi-Seal.  Consider E-Mycin if the patient does not have some results the next day  or so.  We will follow with you.  Thanks very much for this  consultation.      Stephani Police, PA    ______________________________  Llana Aliment Randa Evens, M.D.    MLY/MEDQ  D:  10/06/2008  T:  10/07/2008  Job:  [id number]   cc:   Albertina [place]

## 2011-04-01 LAB — BASIC METABOLIC PANEL
BUN: 1 — ABNORMAL LOW
BUN: 3 — ABNORMAL LOW
CO2: 22
CO2: 23
CO2: 24
CO2: 26
Calcium: 8.2 — ABNORMAL LOW
Calcium: 8.2 — ABNORMAL LOW
Calcium: 8.4
Chloride: 106
Chloride: 108
Chloride: 110
Creatinine, Ser: 0.58
Creatinine, Ser: 0.58
Creatinine, Ser: 0.58
Creatinine, Ser: 0.64
Creatinine, Ser: 0.68
GFR calc Af Amer: >60
GFR calc Af Amer: >60
GFR calc Af Amer: >60
GFR calc non Af Amer: >60
GFR calc non Af Amer: >60
GFR calc non Af Amer: >60
Glucose, Bld: 93
Potassium: 3.5
Potassium: 3.6
Sodium: 137
Sodium: 139
Sodium: 140
Sodium: 141

## 2011-04-01 LAB — PROTIME-INR: INR: 1

## 2011-04-01 LAB — DIFFERENTIAL
Basophils Absolute: 0
Basophils Absolute: 0
Basophils Relative: 0
Basophils Relative: 0
Basophils Relative: 0
Basophils Relative: 0
Eosinophils Absolute: 0
Eosinophils Absolute: 0
Eosinophils Absolute: 0.1
Eosinophils Absolute: 0.3
Eosinophils Relative: 0
Eosinophils Relative: 0
Eosinophils Relative: 0
Eosinophils Relative: 1
Eosinophils Relative: 2
Lymphocytes Relative: 3 — ABNORMAL LOW
Lymphocytes Relative: 3 — ABNORMAL LOW
Lymphocytes Relative: 19
Lymphocytes Relative: 5 — ABNORMAL LOW
Lymphocytes Relative: 9 — ABNORMAL LOW
Lymphs Abs: 0.6 — ABNORMAL LOW
Lymphs Abs: 0.7
Lymphs Abs: 0.3 — ABNORMAL LOW
Lymphs Abs: 0.5 — ABNORMAL LOW
Lymphs Abs: 1.2
Lymphs Abs: 1.5
Monocytes Absolute: 0.4
Monocytes Absolute: 0.6
Monocytes Relative: 2 — ABNORMAL LOW
Monocytes Relative: 4
Monocytes Relative: 6
Monocytes Relative: 7
Monocytes Relative: 8
Monocytes Relative: 9
Neutro Abs: 23 — ABNORMAL HIGH
Neutro Abs: 5.4
Neutro Abs: 8.9 — ABNORMAL HIGH
Neutrophils Relative %: 75
Neutrophils Relative %: 83 — ABNORMAL HIGH
Neutrophils Relative %: 86 — ABNORMAL HIGH
Neutrophils Relative %: 88 — ABNORMAL HIGH

## 2011-04-01 LAB — COMPREHENSIVE METABOLIC PANEL
ALT: 35
ALT: 23
ALT: 24
AST: 35
AST: 40 — ABNORMAL HIGH
AST: 20
AST: 28
Albumin: 3.2 — ABNORMAL LOW
Albumin: 2.8 — ABNORMAL LOW
Alkaline Phosphatase: 56
BUN: 12
BUN: 7
CO2: 23
CO2: 26
Calcium: 8.4
Calcium: 9.5
Calcium: 8.8
Chloride: 109
Chloride: 111
Creatinine, Ser: 0.75
Creatinine, Ser: 0.9
GFR calc Af Amer: >60
GFR calc Af Amer: >60
GFR calc Af Amer: >60
GFR calc non Af Amer: >60
GFR calc non Af Amer: >60
Glucose, Bld: 101 — ABNORMAL HIGH
Potassium: 3.3 — ABNORMAL LOW
Sodium: 136
Sodium: 138
Sodium: 143
Total Bilirubin: 1.3 — ABNORMAL HIGH
Total Bilirubin: 0.5
Total Bilirubin: 1
Total Protein: 7.4
Total Protein: 5.3 — ABNORMAL LOW

## 2011-04-01 LAB — IRON AND TIBC
Iron: 59
UIBC: 185

## 2011-04-01 LAB — CBC
HCT: 39.3
HCT: 32.1 — ABNORMAL LOW
HCT: 33.2 — ABNORMAL LOW
HCT: 34.9 — ABNORMAL LOW
HCT: 35.6 — ABNORMAL LOW
Hemoglobin: 10.3 — ABNORMAL LOW
Hemoglobin: 10.9 — ABNORMAL LOW
Hemoglobin: 11.1 — ABNORMAL LOW
Hemoglobin: 11.6 — ABNORMAL LOW
MCHC: 34.5
MCHC: 35.1
MCHC: 33.5
MCHC: 33.7
MCHC: 34
MCHC: 34.1
MCHC: 34.4
MCV: 96.8
MCV: 99.1
MCV: 100
MCV: 100
MCV: 98.5
MCV: 99.8
Platelets: 226
Platelets: 247
Platelets: 167
Platelets: 178
Platelets: 209
Platelets: 216
RBC: 2.99 — ABNORMAL LOW
RBC: 3.15 — ABNORMAL LOW
RBC: 3.24 — ABNORMAL LOW
RBC: 3.32 — ABNORMAL LOW
RBC: 3.48 — ABNORMAL LOW
RBC: 3.55 — ABNORMAL LOW
RDW: 12.9
RDW: 13.3
WBC: 10.2
WBC: 10.3
WBC: 10.4
WBC: 10.9 — ABNORMAL HIGH
WBC: 13.1 — ABNORMAL HIGH
WBC: 7.1
WBC: 7.9

## 2011-04-01 LAB — GLUCOSE, CAPILLARY
Glucose-Capillary: 136 — ABNORMAL HIGH
Glucose-Capillary: 143 — ABNORMAL HIGH
Glucose-Capillary: 194 — ABNORMAL HIGH
Glucose-Capillary: 102 — ABNORMAL HIGH
Glucose-Capillary: 102 — ABNORMAL HIGH
Glucose-Capillary: 103 — ABNORMAL HIGH
Glucose-Capillary: 106 — ABNORMAL HIGH
Glucose-Capillary: 110 — ABNORMAL HIGH
Glucose-Capillary: 110 — ABNORMAL HIGH
Glucose-Capillary: 111 — ABNORMAL HIGH
Glucose-Capillary: 145 — ABNORMAL HIGH
Glucose-Capillary: 146 — ABNORMAL HIGH
Glucose-Capillary: 152 — ABNORMAL HIGH
Glucose-Capillary: 156 — ABNORMAL HIGH
Glucose-Capillary: 163 — ABNORMAL HIGH
Glucose-Capillary: 172 — ABNORMAL HIGH
Glucose-Capillary: 75
Glucose-Capillary: 95

## 2011-04-01 LAB — MAGNESIUM
Magnesium: 1.7
Magnesium: 2.2

## 2011-04-01 LAB — POCT I-STAT 3, ART BLOOD GAS (G3+)
Acid-base deficit: 1
O2 Saturation: 96
Patient temperature: 100.3
TCO2: 24
pCO2 arterial: 35.9

## 2011-04-01 LAB — URINALYSIS, ROUTINE W REFLEX MICROSCOPIC
Bilirubin Urine: NEGATIVE
Ketones, ur: NEGATIVE
Nitrite: NEGATIVE
Specific Gravity, Urine: 1.017
Urobilinogen, UA: 1
pH: 6.5

## 2011-04-01 LAB — CULTURE, BLOOD (ROUTINE X 2)
Culture: NO GROWTH
Culture: NO GROWTH

## 2011-04-01 LAB — CLOSTRIDIUM DIFFICILE EIA

## 2011-04-01 LAB — LACTIC ACID, PLASMA: Lactic Acid, Venous: 4.6 — ABNORMAL HIGH

## 2011-04-01 LAB — RETICULOCYTES
RBC.: 3.39 — ABNORMAL LOW
Retic Count, Absolute: 88.1
Retic Ct Pct: 2.6

## 2011-04-01 LAB — VITAMIN B12: Vitamin B-12: 382 (ref 211–911)

## 2011-04-01 LAB — CARDIAC PANEL(CRET KIN+CKTOT+MB+TROPI)
Relative Index: 0.6
Troponin I: <0.01

## 2011-04-01 LAB — VANCOMYCIN, TROUGH: Vancomycin Tr: 15.4

## 2011-04-01 LAB — FERRITIN: Ferritin: 138 (ref 22–322)

## 2011-04-01 LAB — URINE CULTURE: Colony Count: NO GROWTH

## 2011-04-01 LAB — APTT: aPTT: 24

## 2011-04-01 LAB — LACTATE DEHYDROGENASE: LDH: 151

## 2011-04-01 LAB — SEDIMENTATION RATE: Sed Rate: 45 — ABNORMAL HIGH

## 2011-04-01 LAB — CK TOTAL AND CKMB (NOT AT ARMC)
CK, MB: 7.9 — ABNORMAL HIGH
Relative Index: 1

## 2011-04-01 LAB — AMYLASE: Amylase: 49

## 2011-04-01 LAB — FOLATE: Folate: 6.9

## 2011-04-01 LAB — OCCULT BLOOD X 1 CARD TO LAB, STOOL: Fecal Occult Bld: NEGATIVE

## 2011-04-02 LAB — BASIC METABOLIC PANEL
BUN: 2 — ABNORMAL LOW
BUN: 6
BUN: 7
BUN: <1 — ABNORMAL LOW
CO2: 23
CO2: 26
Calcium: 7.6 — ABNORMAL LOW
Chloride: 101
Chloride: 104
Chloride: 108
Chloride: 108
Creatinine, Ser: 0.45
Creatinine, Ser: 0.49
Creatinine, Ser: 0.57
GFR calc Af Amer: >60
GFR calc Af Amer: >60
GFR calc Af Amer: >60
GFR calc non Af Amer: >60
GFR calc non Af Amer: >60
GFR calc non Af Amer: >60
Glucose, Bld: 105 — ABNORMAL HIGH
Glucose, Bld: 120 — ABNORMAL HIGH
Potassium: 3.7
Potassium: 3.8
Potassium: 3.9
Sodium: 135

## 2011-04-02 LAB — URINALYSIS, ROUTINE W REFLEX MICROSCOPIC
Bilirubin Urine: NEGATIVE
Ketones, ur: NEGATIVE
Nitrite: NEGATIVE
Protein, ur: NEGATIVE
Urobilinogen, UA: 2 — ABNORMAL HIGH

## 2011-04-02 LAB — CBC
HCT: 40.2
HCT: 41.8
HCT: 43.6
HCT: 46.5
HCT: 47
Hemoglobin: 14.8
Hemoglobin: 15.9
MCHC: 33.7
MCHC: 35.1
MCV: 96.9
MCV: 98.3
MCV: 98.7
MCV: 98.7
Platelets: 219
Platelets: 229
Platelets: 277
RBC: 4.08 — ABNORMAL LOW
RBC: 4.24
WBC: 10.5
WBC: 11.1 — ABNORMAL HIGH
WBC: 16.7 — ABNORMAL HIGH
WBC: 8.8

## 2011-04-02 LAB — LUPUS ANTICOAGULANT PANEL: Lupus Anticoagulant: NOT DETECTED

## 2011-04-02 LAB — DIFFERENTIAL
Basophils Relative: 0
Eosinophils Absolute: 0
Eosinophils Relative: 0
Monocytes Relative: 4
Neutrophils Relative %: 93 — ABNORMAL HIGH

## 2011-04-02 LAB — COMPREHENSIVE METABOLIC PANEL
ALT: 19
AST: 23
Alkaline Phosphatase: 106
BUN: 10
Chloride: 102
GFR calc Af Amer: >60
Total Protein: 7.5

## 2011-04-02 LAB — CARDIOLIPIN ANTIBODIES, IGG, IGM, IGA: Anticardiolipin IgA: <7 — ABNORMAL LOW (ref <13)

## 2011-04-02 LAB — ANTITHROMBIN III: AntiThromb III Func: 89 (ref 76–126)

## 2011-04-02 LAB — CULTURE, BLOOD (ROUTINE X 2): Culture: NO GROWTH

## 2011-04-02 LAB — LACTIC ACID, PLASMA
Lactic Acid, Venous: 1.2
Lactic Acid, Venous: 2.8 — ABNORMAL HIGH

## 2011-04-02 LAB — PHOSPHORUS: Phosphorus: 2.7

## 2011-04-02 LAB — BETA-2-GLYCOPROTEIN I ABS, IGG/M/A
Beta-2-Glycoprotein I IgA: <4 U/mL (ref <10)
Beta-2-Glycoprotein I IgM: <4 U/mL (ref <10)

## 2011-04-02 LAB — FACTOR 5 LEIDEN

## 2011-04-02 LAB — CARDIAC PANEL(CRET KIN+CKTOT+MB+TROPI): Troponin I: 0.01

## 2011-04-12 LAB — URINALYSIS, ROUTINE W REFLEX MICROSCOPIC
Bilirubin Urine: NEGATIVE
Glucose, UA: NEGATIVE
Hgb urine dipstick: NEGATIVE
Ketones, ur: NEGATIVE
Protein, ur: NEGATIVE
Urobilinogen, UA: 1

## 2015-01-17 ENCOUNTER — Emergency Department (HOSPITAL_COMMUNITY)
Admission: EM | Admit: 2015-01-17 | Discharge: 2015-01-17 | Disposition: A | Discharge status: Home / Self Care | Payer: Medicare Other | Attending: Emergency Medicine | Admitting: Emergency Medicine

## 2015-01-17 ENCOUNTER — Encounter (HOSPITAL_COMMUNITY): Payer: Self-pay | Admitting: Emergency Medicine

## 2015-01-17 DIAGNOSIS — Z8659 Personal history of other mental and behavioral disorders: Secondary | ICD-10-CM | POA: Diagnosis not present

## 2015-01-17 DIAGNOSIS — L0291 Cutaneous abscess, unspecified: Secondary | ICD-10-CM

## 2015-01-17 DIAGNOSIS — Z23 Encounter for immunization: Secondary | ICD-10-CM | POA: Diagnosis not present

## 2015-01-17 DIAGNOSIS — L02212 Cutaneous abscess of back [any part, except buttock]: Secondary | ICD-10-CM | POA: Diagnosis not present

## 2015-01-17 DIAGNOSIS — Z8669 Personal history of other diseases of the nervous system and sense organs: Secondary | ICD-10-CM | POA: Diagnosis not present

## 2015-01-17 HISTORY — DX: Cerebral palsy, unspecified: G80.9

## 2015-01-17 HISTORY — DX: Schizoaffective disorder, unspecified: F25.9

## 2015-01-17 HISTORY — DX: Complete traumatic amputation of right lower leg, level unspecified, initial encounter: S88.911A

## 2015-01-17 HISTORY — DX: Complete traumatic amputation of left lower leg, level unspecified, initial encounter: S88.912A

## 2015-01-17 HISTORY — DX: Unspecified psychosis not due to a substance or known physiological condition: F29

## 2015-01-17 MED ORDER — LIDOCAINE HCL (PF) 1 % IJ SOLN: 5.0000 mL | Freq: Once | INTRAMUSCULAR | Status: DC

## 2015-01-17 MED ORDER — TETANUS-DIPHTH-ACELL PERTUSSIS 5-2.5-18.5 LF-MCG/0.5 IM SUSP
0.5000 mL | Freq: Once | INTRAMUSCULAR | Status: AC
  Administered 2015-01-17: 0.5 mL via INTRAMUSCULAR
  Filled 2015-01-17: qty 0.5

## 2015-01-17 MED ORDER — LIDOCAINE HCL 1 % IJ SOLN
INTRAMUSCULAR | Status: AC
  Administered 2015-01-17: 20 mL
  Filled 2015-01-17: qty 20
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
## 2015-01-17 NOTE — Discharge Instructions (Signed)
Abscess °Care After °An abscess (also called a boil or furuncle) is an infected area that contains a collection of pus. Signs and symptoms of an abscess include pain, tenderness, redness, or hardness, or you may feel a moveable soft area under your skin. An abscess can occur anywhere in the body. The infection may spread to surrounding tissues causing cellulitis. A cut (incision) by the surgeon was made over your abscess and the pus was drained out. Gauze may have been packed into the space to provide a drain that will allow the cavity to heal from the inside outwards. The boil may be painful for 5 to 7 days. Most people with a boil do not have high fevers. Your abscess, if seen early, may not have localized, and may not have been lanced. If not, another appointment may be required for this if it does not get better on its own or with medications. °HOME CARE INSTRUCTIONS  °· Only take over-the-counter or prescription medicines for pain, discomfort, or fever as directed by your caregiver. °· When you bathe, soak and then remove gauze or iodoform packs at least daily or as directed by your caregiver. You may then wash the wound gently with mild soapy water. Repack with gauze or do as your caregiver directs. °SEEK IMMEDIATE MEDICAL CARE IF:  °· You develop increased pain, swelling, redness, drainage, or bleeding in the wound site. °· You develop signs of generalized infection including muscle aches, chills, fever, or a general ill feeling. °· An oral temperature above 102° F (38.9° C) develops, not controlled by medication. °See your caregiver for a recheck if you develop any of the symptoms described above. If medications (antibiotics) were prescribed, take them as directed. °Document Released: 01/03/2005 Document Revised: 09/09/2011 Document Reviewed: 08/31/2007 °ExitCare® Patient Information ©2015 [employer]. This information is not intended to replace advice given to you by your health care provider. Make sure  you discuss any questions you have with your health care provider. ° °Abscess °An abscess is an infected area that contains a collection of pus and debris. It can occur in almost any part of the body. An abscess is also known as a furuncle or boil. °CAUSES  °An abscess occurs when tissue gets infected. This can occur from blockage of oil or sweat glands, infection of hair follicles, or a minor injury to the skin. As the body tries to fight the infection, pus collects in the area and creates pressure under the skin. This pressure causes pain. People with weakened immune systems have difficulty fighting infections and get certain abscesses more often.  °SYMPTOMS °Usually an abscess develops on the skin and becomes a painful mass that is red, warm, and tender. If the abscess forms under the skin, you may feel a moveable soft area under the skin. Some abscesses break open (rupture) on their own, but most will continue to get worse without care. The infection can spread deeper into the body and eventually into the bloodstream, causing you to feel ill.  °DIAGNOSIS  °Your caregiver will take your medical history and perform a physical exam. A sample of fluid may also be taken from the abscess to determine what is causing your infection. °TREATMENT  °Your caregiver may prescribe antibiotic medicines to fight the infection. However, taking antibiotics alone usually does not cure an abscess. Your caregiver may need to make a small cut (incision) in the abscess to drain the pus. In some cases, gauze is packed into the abscess to reduce pain and to   continue draining the area. °HOME CARE INSTRUCTIONS  °· Only take over-the-counter or prescription medicines for pain, discomfort, or fever as directed by your caregiver. °· If you were prescribed antibiotics, take them as directed. Finish them even if you start to feel better. °· If gauze is used, follow your caregiver's directions for changing the gauze. °· To avoid spreading the  infection: °¨ Keep your draining abscess covered with a bandage. °¨ Wash your hands well. °¨ Do not share personal care items, towels, or whirlpools with others. °¨ Avoid skin contact with others. °· Keep your skin and clothes clean around the abscess. °· Keep all follow-up appointments as directed by your caregiver. °SEEK MEDICAL CARE IF:  °· You have increased pain, swelling, redness, fluid drainage, or bleeding. °· You have muscle aches, chills, or a general ill feeling. °· You have a fever. °MAKE SURE YOU:  °· Understand these instructions. °· Will watch your condition. °· Will get help right away if you are not doing well or get worse. °Document Released: 03/27/2005 Document Revised: 12/17/2011 Document Reviewed: 08/30/2011 °ExitCare® Patient Information ©2015 [employer]. This information is not intended to replace advice given to you by your health care provider. Make sure you discuss any questions you have with your health care provider. ° °

## 2015-01-17 NOTE — ED Notes (Signed)
Swollen, red ,raised abscess noted on upper back. Pt c/o pain yesterday.egiver noted swelling yesterday. Pain has increased today. Small amount of bloody drainage noted on pad.

## 2015-05-01 ENCOUNTER — Emergency Department (HOSPITAL_COMMUNITY)
Admission: EM | Admit: 2015-05-01 | Discharge: 2015-05-01 | Disposition: A | Discharge status: Home / Self Care | Payer: Medicare Other | Attending: Emergency Medicine | Admitting: Emergency Medicine

## 2015-05-01 ENCOUNTER — Emergency Department (HOSPITAL_COMMUNITY): Payer: Medicare Other

## 2015-05-01 ENCOUNTER — Encounter (HOSPITAL_COMMUNITY): Payer: Self-pay

## 2015-05-01 DIAGNOSIS — Z7952 Long term (current) use of systemic steroids: Secondary | ICD-10-CM | POA: Diagnosis not present

## 2015-05-01 DIAGNOSIS — W19XXXA Unspecified fall, initial encounter: Secondary | ICD-10-CM

## 2015-05-01 DIAGNOSIS — S0990XA Unspecified injury of head, initial encounter: Secondary | ICD-10-CM | POA: Insufficient documentation

## 2015-05-01 DIAGNOSIS — S299XXA Unspecified injury of thorax, initial encounter: Secondary | ICD-10-CM | POA: Insufficient documentation

## 2015-05-01 DIAGNOSIS — Z8669 Personal history of other diseases of the nervous system and sense organs: Secondary | ICD-10-CM | POA: Insufficient documentation

## 2015-05-01 DIAGNOSIS — Z79899 Other long term (current) drug therapy: Secondary | ICD-10-CM | POA: Insufficient documentation

## 2015-05-01 DIAGNOSIS — Y998 Other external cause status: Secondary | ICD-10-CM | POA: Diagnosis not present

## 2015-05-01 DIAGNOSIS — Y92811 Bus as the place of occurrence of the external cause: Secondary | ICD-10-CM | POA: Diagnosis not present

## 2015-05-01 DIAGNOSIS — W050XXA Fall from non-moving wheelchair, initial encounter: Secondary | ICD-10-CM | POA: Diagnosis not present

## 2015-05-01 DIAGNOSIS — F259 Schizoaffective disorder, unspecified: Secondary | ICD-10-CM | POA: Diagnosis not present

## 2015-05-01 DIAGNOSIS — Y9389 Activity, other specified: Secondary | ICD-10-CM | POA: Insufficient documentation

## 2015-05-01 NOTE — Discharge Instructions (Signed)
Tylenol for pain.  Follow-up as needed 

## 2015-05-01 NOTE — Progress Notes (Signed)
Pt states pcp is Dr Nehemiah SettlePolite EPIC updated  Male visitor to see pt
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
## 2015-10-18 ENCOUNTER — Other Ambulatory Visit: Payer: Self-pay | Admitting: General Surgery

## 2015-11-10 ENCOUNTER — Encounter (HOSPITAL_COMMUNITY): Payer: Self-pay | Admitting: *Deleted

## 2015-11-10 NOTE — Progress Notes (Signed)
Mr [name] lives in an [facility], [place] own [place].

## 2015-11-12 MED ORDER — CHLORHEXIDINE GLUCONATE 4 % EX LIQD: 1.0000 "application " | Freq: Once | CUTANEOUS | Status: DC

## 2015-11-12 NOTE — H&P (Signed)
[name]  Location: [facility] Patient #: [id number] DOB: 01-Sep-1965 Single / Language: [place] / Race: White Male        History of Present Illness       The patient is a 50 year old male who presents with a complaint of chronic abscess mid back. This is a 50 year old man, referred by Dr. Knox [name] for evaluation of recurrent abscess of the mid back.      Patient has significant medical problems with cerebral palsy, amputation of both extremities, psychotic disorder, mild mental retardation GERD. He is here with one of the gentleman that helps him in the group home. He lives in a group home: [name].      He has had recurring infections in the mid back for a few years. They will drain and he will get better. A physician will it and it will get better but has to recur. He last saw Dr. in [place] on April 6. Incision and drainage was performed. Things have settled down for now but he would like something definitive done.     He is on numerous medications but no anticoagulants and no immune modulators.      I offered to completely excise this in the operating room at [facility] and he would like to have that done. I explained to him that we will make an elliptical incision and excise the entire mass. I told him it might be sutured together or might be packed open. I discussed the indications, details, and numerous risk of the surgery with him and his caregiver. They're aware the risks of bleeding, recurrent infection, nerve damage with chronic pain or numbness. They understand these issues. All questions were answered. They agree with this plan.   Other Problems Back Pain  Diagnostic Studies History  Colonoscopy never  Allergies  No Known Drug Allergies  Medication History DiphenhydrAMINE HCl (  Tablet, Oral) Active. FLUoxetine HCl (  Capsule, Oral) Active. Gabapentin (  Capsule, Oral) Active. Gabapentin (   Capsule, Oral) Active. Amitiza ( Capsule, Oral) Active. Meclizine HCl (  Tablet, Oral) Active. Metoclopramide HCl (  Tablet, Oral) Active. Omeprazole (  Capsule DR, Oral) Active. Pravastatin Sodium (  Tablet, Oral) Active. FLUoxetine HCl (  Capsule, Oral) Active. Vitamin C Plus (  Tablet, Oral) Active. Vitamin E (400UNIT Capsule, Oral) Active. Medications Reconciled  Social History  No alcohol use No caffeine use No drug use Tobacco use Never smoker.  Family History  Depression Father. Hypertension Father.    Review of Systems  General Not Present- Appetite Loss, Chills, Fatigue, Fever, Night Sweats, Weight Gain and Weight Loss. Skin Not Present- Change in Wart/Mole, Dryness, Hives, Jaundice, New Lesions, Non-Healing Wounds, Rash and Ulcer. HEENT Not Present- Earache, Hearing Loss, Hoarseness, Nose Bleed, Oral Ulcers, Ringing in the Ears, Seasonal Allergies, Sinus Pain, Sore Throat, Visual Disturbances, Wears glasses/contact lenses and Yellow Eyes. Respiratory Not Present- Bloody sputum, Chronic Cough, Difficulty Breathing, Snoring and Wheezing. Cardiovascular Not Present- Chest Pain, Difficulty Breathing Lying Down, Leg Cramps, Palpitations, Rapid Heart Rate, Shortness of Breath and Swelling of Extremities. Gastrointestinal Not Present- Abdominal Pain, Bloating, Bloody Stool, Change in Bowel Habits, Chronic diarrhea, Constipation, Difficulty Swallowing, Excessive gas, Gets full quickly at meals, Hemorrhoids, Indigestion, Nausea, Rectal Pain and Vomiting. Neurological Not Present- Decreased Memory, Fainting, Headaches, Numbness, Seizures, Tingling, Tremor, Trouble walking and Weakness. Endocrine Not Present- Cold Intolerance, Excessive Hunger, Hair Changes, Heat Intolerance, Hot flashes and New Diabetes.  Vitals  Weight Measurement Declined Height Measurement Declined Temp.:  97.86F  Pulse: 69 (Regular)  BP: 128/78 (Sitting, Left Arm,  Standard)     Physical Exam  General Mental Status-Alert. General Appearance-Not in acute distress. Build & Nutrition-Well nourished. Posture-Normal posture. Gait-Normal.  Integumentary Note: In the mid back there is a 1.5 cm mass. This looks like a chronically infected sebaceous cyst. There is no abscess or cellulitis today. It is not particularly tender. Minimal drainage. Redressed.   Head and Neck Head-normocephalic, atraumatic with no lesions or palpable masses. Trachea-midline. Thyroid Gland Characteristics - normal size and consistency and no palpable nodules.  Chest and Lung Exam Chest and lung exam reveals -on auscultation, normal breath sounds, no adventitious sounds and normal vocal resonance.  Cardiovascular Cardiovascular examination reveals -normal heart sounds, regular rate and rhythm with no murmurs and femoral artery auscultation bilaterally reveals normal pulses, no bruits, no thrills.  Abdomen Inspection Inspection of the abdomen reveals - No Hernias. Palpation/Percussion Palpation and Percussion of the abdomen reveal - Soft, Non Tender, No Rigidity (guarding), No hepatosplenomegaly and No Palpable abdominal masses.  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 Does sit forward in the chair and is cooperative. He seems to understand the implications of treatment decisions and appears competent to sign his own permit. Speech is a little slurred but he is appropriate in his responses.   Musculoskeletal Note: In a wheelchair. Bilateral lower extremity amputations     Assessment & Plan BACK ABSCESS [phone](L02.212) .  You have a chronically infected abscess of your mid back. 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 Derrell LollingIngram, M.D., [facility] Center[facility]ACS [facility] General and Minimally invasive Surgery Breast and Colorectal Surgery Office:   [phone] Pager:   [phone]

## 2015-11-13 ENCOUNTER — Ambulatory Visit (HOSPITAL_COMMUNITY)
Admission: RE | Admit: 2015-11-13 | Discharge: 2015-11-13 | Disposition: A | Discharge status: Home / Self Care | Payer: Medicare Other | Source: Ambulatory Visit | Attending: General Surgery | Admitting: General Surgery

## 2015-11-13 ENCOUNTER — Ambulatory Visit (HOSPITAL_COMMUNITY): Payer: Medicare Other | Admitting: Anesthesiology

## 2015-11-13 ENCOUNTER — Encounter (HOSPITAL_COMMUNITY)
Admission: RE | Disposition: A | Discharge status: Home / Self Care | Payer: Self-pay | Source: Ambulatory Visit | Attending: General Surgery

## 2015-11-13 ENCOUNTER — Encounter (HOSPITAL_COMMUNITY): Payer: Self-pay | Admitting: *Deleted

## 2015-11-13 DIAGNOSIS — L720 Epidermal cyst: Secondary | ICD-10-CM

## 2015-11-13 DIAGNOSIS — Z79899 Other long term (current) drug therapy: Secondary | ICD-10-CM | POA: Insufficient documentation

## 2015-11-13 DIAGNOSIS — K219 Gastro-esophageal reflux disease without esophagitis: Secondary | ICD-10-CM | POA: Diagnosis not present

## 2015-11-13 DIAGNOSIS — R222 Localized swelling, mass and lump, trunk: Secondary | ICD-10-CM | POA: Diagnosis present

## 2015-11-13 DIAGNOSIS — G809 Cerebral palsy, unspecified: Secondary | ICD-10-CM | POA: Diagnosis not present

## 2015-11-13 DIAGNOSIS — L905 Scar conditions and fibrosis of skin: Secondary | ICD-10-CM | POA: Insufficient documentation

## 2015-11-13 DIAGNOSIS — F209 Schizophrenia, unspecified: Secondary | ICD-10-CM | POA: Diagnosis not present

## 2015-11-13 DIAGNOSIS — F70 Mild intellectual disabilities: Secondary | ICD-10-CM | POA: Diagnosis not present

## 2015-11-13 DIAGNOSIS — Z89202 Acquired absence of left upper limb, unspecified level: Secondary | ICD-10-CM | POA: Diagnosis not present

## 2015-11-13 DIAGNOSIS — Z89201 Acquired absence of right upper limb, unspecified level: Secondary | ICD-10-CM | POA: Insufficient documentation

## 2015-11-13 HISTORY — DX: Epidermal cyst: L72.0

## 2015-11-13 HISTORY — PX: MASS EXCISION: SHX2000

## 2015-11-13 HISTORY — DX: Constipation, unspecified: K59.00

## 2015-11-13 LAB — CBC WITH DIFFERENTIAL/PLATELET
BASOS PCT: 0 %
Basophils Absolute: 0 10*3/uL (ref 0.0–0.1)
EOS ABS: 0.1 10*3/uL (ref 0.0–0.7)
EOS PCT: 2 %
HCT: 46.2 % (ref 39.0–52.0)
HEMOGLOBIN: 15.5 g/dL (ref 13.0–17.0)
Lymphocytes Relative: 37 %
Lymphs Abs: 2.9 10*3/uL (ref 0.7–4.0)
MCH: 34.4 pg — ABNORMAL HIGH (ref 26.0–34.0)
MCHC: 33.5 g/dL (ref 30.0–36.0)
MCV: 102.7 fL — ABNORMAL HIGH (ref 78.0–100.0)
Monocytes Absolute: 0.5 10*3/uL (ref 0.1–1.0)
Monocytes Relative: 6 %
NEUTROS PCT: 55 %
Neutro Abs: 4.2 10*3/uL (ref 1.7–7.7)
PLATELETS: 220 10*3/uL (ref 150–400)
RBC: 4.5 MIL/uL (ref 4.22–5.81)
RDW: 13.6 % (ref 11.5–15.5)
WBC: 7.7 10*3/uL (ref 4.0–10.5)

## 2015-11-13 SURGERY — MINOR EXCISION OF MASS
Anesthesia: Monitor Anesthesia Care | Site: Back

## 2015-11-13 MED ORDER — SODIUM BICARBONATE 4 % IV SOLN
INTRAVENOUS | Status: AC
  Filled 2015-11-13: qty 5

## 2015-11-13 MED ORDER — PROPOFOL 10 MG/ML IV BOLUS
INTRAVENOUS | Status: DC | PRN
  Administered 2015-11-13: 20 mg via INTRAVENOUS

## 2015-11-13 MED ORDER — FENTANYL CITRATE (PF) 250 MCG/5ML IJ SOLN
INTRAMUSCULAR | Status: AC
  Filled 2015-11-13: qty 5

## 2015-11-13 MED ORDER — ONDANSETRON HCL 4 MG/2ML IJ SOLN
INTRAMUSCULAR | Status: AC
  Filled 2015-11-13: qty 2

## 2015-11-13 MED ORDER — MIDAZOLAM HCL 5 MG/5ML IJ SOLN
INTRAMUSCULAR | Status: DC | PRN
  Administered 2015-11-13: 2 mg via INTRAVENOUS

## 2015-11-13 MED ORDER — PROPOFOL 10 MG/ML IV BOLUS
INTRAVENOUS | Status: AC
  Filled 2015-11-13: qty 20

## 2015-11-13 MED ORDER — OXYCODONE HCL 5 MG PO TABS: 5.0000 mg | ORAL_TABLET | ORAL | Status: DC | PRN

## 2015-11-13 MED ORDER — ACETAMINOPHEN 650 MG RE SUPP: 650.0000 mg | RECTAL | Status: DC | PRN

## 2015-11-13 MED ORDER — LACTATED RINGERS IV SOLN
INTRAVENOUS | Status: DC
  Administered 2015-11-13: 10:00:00 via INTRAVENOUS

## 2015-11-13 MED ORDER — FENTANYL CITRATE (PF) 100 MCG/2ML IJ SOLN: 25.0000 ug | INTRAMUSCULAR | Status: DC | PRN

## 2015-11-13 MED ORDER — LIDOCAINE-EPINEPHRINE (PF) 1 %-1:200000 IJ SOLN
INTRAMUSCULAR | Status: DC | PRN
  Administered 2015-11-13: 30 mL

## 2015-11-13 MED ORDER — PHENYLEPHRINE 40 MCG/ML (10ML) SYRINGE FOR IV PUSH (FOR BLOOD PRESSURE SUPPORT)
PREFILLED_SYRINGE | INTRAVENOUS | Status: AC
  Filled 2015-11-13: qty 10

## 2015-11-13 MED ORDER — PROPOFOL 500 MG/50ML IV EMUL
INTRAVENOUS | Status: DC | PRN
  Administered 2015-11-13: 75 ug/kg/min via INTRAVENOUS

## 2015-11-13 MED ORDER — LIDOCAINE 2% (20 MG/ML) 5 ML SYRINGE
INTRAMUSCULAR | Status: AC
  Filled 2015-11-13: qty 5

## 2015-11-13 MED ORDER — ACETAMINOPHEN 325 MG PO TABS: 650.0000 mg | ORAL_TABLET | ORAL | Status: DC | PRN

## 2015-11-13 MED ORDER — FENTANYL CITRATE (PF) 250 MCG/5ML IJ SOLN: INTRAMUSCULAR | Status: DC | PRN

## 2015-11-13 MED ORDER — MIDAZOLAM HCL 2 MG/2ML IJ SOLN
INTRAMUSCULAR | Status: AC
  Filled 2015-11-13: qty 2

## 2015-11-13 MED ORDER — 0.9 % SODIUM CHLORIDE (POUR BTL) OPTIME
TOPICAL | Status: DC | PRN
  Administered 2015-11-13: 1000 mL

## 2015-11-13 MED ORDER — ACETAMINOPHEN 500 MG PO TABS: 1000.0000 mg | ORAL_TABLET | Freq: Four times a day (QID) | ORAL | Status: DC

## 2015-11-13 MED ORDER — PHENYLEPHRINE HCL 10 MG/ML IJ SOLN
INTRAMUSCULAR | Status: DC | PRN
  Administered 2015-11-13: 80 ug via INTRAVENOUS

## 2015-11-13 MED ORDER — LIDOCAINE-EPINEPHRINE (PF) 1 %-1:200000 IJ SOLN
INTRAMUSCULAR | Status: AC
  Filled 2015-11-13: qty 30

## 2015-11-13 MED ORDER — SODIUM BICARBONATE 4 % IV SOLN
INTRAVENOUS | Status: DC | PRN
  Administered 2015-11-13: 5 mL via SUBCUTANEOUS

## 2015-11-13 SURGICAL SUPPLY — 46 items
APL SKNCLS STERI-STRIP NONHPOA (GAUZE/BANDAGES/DRESSINGS) ×1
BENZOIN TINCTURE PRP APPL 2/3 (GAUZE/BANDAGES/DRESSINGS) ×3 IMPLANT
CANISTER SUCTION 2500CC (MISCELLANEOUS) ×2 IMPLANT
CHLORAPREP W/TINT 26ML (MISCELLANEOUS) ×3 IMPLANT
CLOSURE WOUND 1/2 X4 (GAUZE/BANDAGES/DRESSINGS) ×1
COVER SURGICAL LIGHT HANDLE (MISCELLANEOUS) ×3 IMPLANT
DECANTER SPIKE VIAL GLASS SM (MISCELLANEOUS) ×2 IMPLANT
DRAPE LAPAROTOMY T 102X78X121 (DRAPES) ×2 IMPLANT
DRAPE LAPAROTOMY T 98X78 PEDS (DRAPES) ×1 IMPLANT
DRAPE UTILITY XL STRL (DRAPES) ×6 IMPLANT
ELECT CAUTERY BLADE 6.4 (BLADE) ×3 IMPLANT
ELECT REM PT RETURN 9FT ADLT (ELECTROSURGICAL) ×3
ELECTRODE REM PT RTRN 9FT ADLT (ELECTROSURGICAL) ×1 IMPLANT
GAUZE SPONGE 4X4 12PLY STRL (GAUZE/BANDAGES/DRESSINGS) ×3 IMPLANT
GAUZE SPONGE 4X4 16PLY XRAY LF (GAUZE/BANDAGES/DRESSINGS) ×1 IMPLANT
GLOVE BIO SURGEON STRL SZ8 (GLOVE) ×2 IMPLANT
GLOVE EUDERMIC 7 POWDERFREE (GLOVE) ×3 IMPLANT
GOWN STRL REUS W/ TWL LRG LVL3 (GOWN DISPOSABLE) ×1 IMPLANT
GOWN STRL REUS W/ TWL XL LVL3 (GOWN DISPOSABLE) ×1 IMPLANT
GOWN STRL REUS W/TWL LRG LVL3 (GOWN DISPOSABLE)
GOWN STRL REUS W/TWL XL LVL3 (GOWN DISPOSABLE) ×6
KIT ROOM TURNOVER OR (KITS) ×3 IMPLANT
NDL HYPO 25GX1X1/2 BEV (NEEDLE) IMPLANT
NEEDLE HYPO 25GX1X1/2 BEV (NEEDLE) ×3 IMPLANT
NS IRRIG 1000ML POUR BTL (IV SOLUTION) ×3 IMPLANT
PACK GENERAL/GYN (CUSTOM PROCEDURE TRAY) ×2 IMPLANT
PACK SURGICAL SETUP 50X90 (CUSTOM PROCEDURE TRAY) ×1 IMPLANT
PAD ARMBOARD 7.5X6 YLW CONV (MISCELLANEOUS) ×4 IMPLANT
PENCIL BUTTON HOLSTER BLD 10FT (ELECTRODE) ×1 IMPLANT
SPECIMEN JAR SMALL (MISCELLANEOUS) ×3 IMPLANT
SPONGE GAUZE 4X4 12PLY STER LF (GAUZE/BANDAGES/DRESSINGS) ×2 IMPLANT
SPONGE LAP 4X18 X RAY DECT (DISPOSABLE) IMPLANT
STAPLER VISISTAT 35W (STAPLE) IMPLANT
STRIP CLOSURE SKIN 1/2X4 (GAUZE/BANDAGES/DRESSINGS) ×2 IMPLANT
SUT ETHILON 2 0 FS 18 (SUTURE) ×4 IMPLANT
SUT MNCRL AB 4-0 PS2 18 (SUTURE) ×1 IMPLANT
SUT VIC AB 3-0 SH 27 (SUTURE)
SUT VIC AB 3-0 SH 27XBRD (SUTURE) ×1 IMPLANT
SYR BULB 3OZ (MISCELLANEOUS) ×1 IMPLANT
SYR CONTROL 10ML LL (SYRINGE) ×2 IMPLANT
TOWEL OR 17X24 6PK STRL BLUE (TOWEL DISPOSABLE) ×3 IMPLANT
TOWEL OR 17X26 10 PK STRL BLUE (TOWEL DISPOSABLE) ×3 IMPLANT
TUBE CONNECTING 12'X1/4 (SUCTIONS)
TUBE CONNECTING 12X1/4 (SUCTIONS) IMPLANT
WATER STERILE IRR 1000ML POUR (IV SOLUTION) ×1 IMPLANT
YANKAUER SUCT BULB TIP NO VENT (SUCTIONS) IMPLANT

## 2015-11-13 NOTE — Anesthesia Postprocedure Evaluation (Signed)
Anesthesia Post Note  Patient: Christian FassKeith A [name]  Procedure(s) Performed: Procedure(s) (LRB): EXCISION BACK MASS 1.5 CM    (MINOR PROCEDURE)  (N/A)  Patient location during evaluation: PACU Anesthesia Type: General Level of consciousness: awake and alert Pain management: pain level controlled Vital Signs Assessment: post-procedure vital signs reviewed and stable Respiratory status: spontaneous breathing, nonlabored ventilation and respiratory function stable Cardiovascular status: blood pressure returned to baseline and stable Postop Assessment: no signs of nausea or vomiting Anesthetic complications: no    Last Vitals:  Filed Vitals:   11/13/15 1215 11/13/15 1220  BP: 103/65 106/57  Pulse: 67 69  Temp: 36.8 C   Resp:  16    Last Pain: There were no vitals filed for this visit.               [name]

## 2015-11-13 NOTE — Anesthesia Procedure Notes (Signed)
Procedure Name: MAC Date/Time: 11/13/2015 10:57 AM Performed by: Sheppard EvensMANESS, Ione Sandusky B Pre-anesthesia Checklist: Patient identified, Emergency Drugs available, Suction available, Patient being monitored and Timeout performed Patient Re-evaluated:Patient Re-evaluated prior to inductionOxygen Delivery Method: Simple face mask

## 2015-11-13 NOTE — Anesthesia Preprocedure Evaluation (Addendum)
Anesthesia Evaluation  Patient identified by MRN, date of birth, ID band Patient awake    Reviewed: Allergy & Precautions, NPO status , Patient's Chart, lab work & pertinent test results  Airway Mallampati: II  TM Distance: >3 FB Neck ROM: Full    Dental no notable dental hx.    Pulmonary neg pulmonary ROS,    Pulmonary exam normal breath sounds clear to auscultation       Cardiovascular negative cardio ROS Normal cardiovascular exam Rhythm:Regular Rate:Normal     Neuro/Psych Schizophrenia CP    GI/Hepatic negative GI ROS, Neg liver ROS,   Endo/Other  negative endocrine ROS  Renal/GU negative Renal ROS  negative genitourinary   Musculoskeletal negative musculoskeletal ROS (+)   Abdominal   Peds negative pediatric ROS (+)  Hematology negative hematology ROS (+)   Anesthesia Other Findings   Reproductive/Obstetrics negative OB ROS                             Anesthesia Physical Anesthesia Plan  ASA: II  Anesthesia Plan: MAC   Post-op Pain Management:    Induction: Intravenous  Airway Management Planned: Simple Face Mask  Additional Equipment:   Intra-op Plan:   Post-operative Plan:   Informed Consent: I have reviewed the patients History and Physical, chart, labs and discussed the procedure including the risks, benefits and alternatives for the proposed anesthesia with the patient or authorized representative who has indicated his/her understanding and acceptance.   Dental advisory given  Plan Discussed with: CRNA and Surgeon  Anesthesia Plan Comments:         Anesthesia Quick Evaluation

## 2015-11-13 NOTE — Interval H&P Note (Signed)
History and Physical Interval Note:  11/13/2015 10:07 AM  [name]  has presented today for surgery, with the diagnosis of Back mass 1.5 cm  The various methods of treatment have been discussed with the patient and family. After consideration of risks, benefits and other options for treatment, the patient has consented to  Procedure(s): EXCISION BACK MASS 1.5 CM    (MINOR PROCEDURE)  (N/A) as a surgical intervention .  The patient's history has been reviewed, patient examined, no change in status, stable for surgery.  I have reviewed the patient's chart and labs.  Questions were answered to the patient's satisfaction.     Ernestene MentionINGRAM,Mykiah Schmuck M

## 2015-11-13 NOTE — Progress Notes (Signed)
Caregiver called at 1300. Believed to have taken the whole patient chart with him.  Left a voicemail. Waiting on a return phone call.

## 2015-11-13 NOTE — Transfer of Care (Signed)
Immediate Anesthesia Transfer of Care Note  Patient: [name]  Procedure(s) Performed: Procedure(s): EXCISION BACK MASS 1.5 CM    (MINOR PROCEDURE)  (N/A)  Patient Location: PACU  Anesthesia Type:MAC  Level of Consciousness: awake and alert   Airway & Oxygen Therapy: Patient Spontanous Breathing  Post-op Assessment: Report given to RN and Post -op Vital signs reviewed and stable  Post vital signs: Reviewed and stable  Last Vitals:  Filed Vitals:   11/13/15 0827  BP: 111/55  Pulse: 64  Temp: 36.8 C  Resp: 20    Last Pain: There were no vitals filed for this visit.    Patients Stated Pain Goal: 3 (11/13/15 0841)  Complications: No apparent anesthesia complications

## 2015-11-13 NOTE — Discharge Instructions (Signed)
The incision is closed with 3 nylon sutures. The sutures will be removed in the office in 2 weeks  Keep the wound clean and dry. Change the dry gauze bandage daily Do not use any cream or ointment  Sponge bathe for about a week He may shower after that  We have agreed to use Tylenol for pain  Call to set up an appointment to see Dr. Derrell LollingIngram in 2 weeks.  The sutures will be removed at that time

## 2015-11-13 NOTE — Op Note (Signed)
  Patient Name:           [name]   Date of Surgery:        11/13/2015  Pre op Diagnosis:      Soft tissue mass upper back, 2 cm  Post op Diagnosis:    Same  Procedure:                 Excision 2 cm soft tissue mass upper back  Surgeon:                     Angelia MouldHaywood M. Derrell LollingIngram, M.D., FACS  Assistant:                      Or staff  Operative Indications:   The patient is a 50 year old male who presents with a complaint of chronic abscess mid back. This is a 50 year old man, referred by Dr. Knox RoyaltyEnrico Jones for evaluation of recurrent abscess of the mid back.  Patient has significant medical problems with cerebral palsy, amputation of both extremities, psychotic disorder, mild mental retardation GERD. He is here with one of the gentleman that helps him in the group home. [facility].  He has had recurring infections in the mid back for a few years. They will drain and he will get better. A physician will lance  it and it will get better but has to recur. He last saw Dr. Chinita GreenlandEnriico Jones on April 6. Incision and drainage was performed. Things have settled down for now but he would like something definitive done.  He is on numerous medications but no anticoagulants and no immune modulators. Exam reveals a 2 cm subcutaneous mass that is not actively infected, upper mid back, midline.  Consistent with history  Operative Findings:       Chronic, fibrotic mass.  No purulence.  Required 2.5 cm x 1.75 cm incision.  Procedure in Detail:          The patient was brought to the operating room and placed in the left lateral decubitus position with appropriate padding.  The back was prepped and draped in a sterile fashion.  He was monitored and sedated by the anesthesia department.  1% Xylocaine with epinephrine was used as local infiltration anesthetic.  A sagittally oriented 2.5 x 1.75 cm incision was made to completely encompass this mass.  Dissection  was carried down into the deep subcutaneous tissue and ultimately we felt that we got all the way around this.  The tissues are very chronically fibrotic but not actively infected.  The mass was sent to the lab.  Hemostasis was excellent.  It was irrigated.  I felt that there was some increased risk of infection and so I simply closed the wound with some loose, simple interrupted 2-0 nylons.  The skin of the back was extremely thick.     Clean bandages placed and the patient taken to PACU in stable condition.  EBL less than 5 mL.  Counts correct.  Complications none.     Angelia MouldHaywood M. Derrell LollingIngram, M.D., FACS General and Minimally Invasive Surgery Breast and Colorectal Surgery  11/13/2015 11:37 AM

## 2015-11-14 ENCOUNTER — Encounter (HOSPITAL_COMMUNITY): Payer: Self-pay | Admitting: General Surgery

## 2015-11-14 NOTE — Progress Notes (Signed)
Quick Note:  Inform patient of Pathology report,.benign fibrosis consistent with chronic infection.  hmi ______

## 2016-09-02 ENCOUNTER — Emergency Department (HOSPITAL_COMMUNITY)
Admission: EM | Admit: 2016-09-02 | Discharge: 2016-09-02 | Disposition: A | Discharge status: Home / Self Care | Payer: Medicare Other | Attending: Emergency Medicine | Admitting: Emergency Medicine

## 2016-09-02 ENCOUNTER — Emergency Department (HOSPITAL_COMMUNITY): Payer: Medicare Other

## 2016-09-02 ENCOUNTER — Encounter (HOSPITAL_COMMUNITY): Payer: Self-pay | Admitting: *Deleted

## 2016-09-02 DIAGNOSIS — Z79899 Other long term (current) drug therapy: Secondary | ICD-10-CM | POA: Diagnosis not present

## 2016-09-02 DIAGNOSIS — R0789 Other chest pain: Secondary | ICD-10-CM | POA: Diagnosis present

## 2016-09-02 LAB — COMPREHENSIVE METABOLIC PANEL
ALBUMIN: 3.9 g/dL (ref 3.5–5.0)
ALT: 25 U/L (ref 17–63)
ANION GAP: 6 (ref 5–15)
AST: 23 U/L (ref 15–41)
Alkaline Phosphatase: 67 U/L (ref 38–126)
BUN: 15 mg/dL (ref 6–20)
CALCIUM: 9.1 mg/dL (ref 8.9–10.3)
CHLORIDE: 109 mmol/L (ref 101–111)
CO2: 26 mmol/L (ref 22–32)
CREATININE: 0.56 mg/dL — AB (ref 0.61–1.24)
GFR calc non Af Amer: >60 mL/min (ref >60)
GLUCOSE: 85 mg/dL (ref 65–99)
Potassium: 4.3 mmol/L (ref 3.5–5.1)
SODIUM: 141 mmol/L (ref 135–145)
Total Bilirubin: 0.7 mg/dL (ref 0.3–1.2)
Total Protein: 6.8 g/dL (ref 6.5–8.1)

## 2016-09-02 LAB — CBC WITH DIFFERENTIAL/PLATELET
BASOS PCT: 0 %
Basophils Absolute: 0 10*3/uL (ref 0.0–0.1)
EOS ABS: 0.1 10*3/uL (ref 0.0–0.7)
EOS PCT: 1 %
HCT: 44.6 % (ref 39.0–52.0)
Hemoglobin: 14.9 g/dL (ref 13.0–17.0)
Lymphocytes Relative: 19 %
Lymphs Abs: 1.8 10*3/uL (ref 0.7–4.0)
MCH: 33.7 pg (ref 26.0–34.0)
MCHC: 33.4 g/dL (ref 30.0–36.0)
MCV: 100.9 fL — ABNORMAL HIGH (ref 78.0–100.0)
Monocytes Absolute: 0.5 10*3/uL (ref 0.1–1.0)
Monocytes Relative: 6 %
Neutro Abs: 7.3 10*3/uL (ref 1.7–7.7)
Neutrophils Relative %: 74 %
PLATELETS: 215 10*3/uL (ref 150–400)
RBC: 4.42 MIL/uL (ref 4.22–5.81)
RDW: 13 % (ref 11.5–15.5)
WBC: 9.8 10*3/uL (ref 4.0–10.5)

## 2016-09-02 LAB — I-STAT TROPONIN, ED
Troponin i, poc: 0 ng/mL (ref 0.00–0.08)
Troponin i, poc: 0 ng/mL (ref 0.00–0.08)

## 2016-09-02 LAB — LIPASE, BLOOD: Lipase: 19 U/L (ref 11–51)

## 2016-09-02 MED ORDER — KETOROLAC TROMETHAMINE 60 MG/2ML IM SOLN
30.0000 mg | Freq: Once | INTRAMUSCULAR | Status: DC
  Administered 2016-09-02: 15 mg via INTRAMUSCULAR
  Filled 2016-09-02: qty 2

## 2016-09-02 MED ORDER — KETOROLAC TROMETHAMINE 15 MG/ML IJ SOLN: 15.0000 mg | Freq: Once | INTRAMUSCULAR | Status: DC

## 2016-09-02 MED ORDER — IBUPROFEN 600 MG PO TABS: 600.0000 mg | ORAL_TABLET | Freq: Four times a day (QID) | ORAL | 0 refills | Status: AC | PRN

## 2016-09-02 NOTE — ED Notes (Signed)
ED resident at bedside.

## 2016-09-02 NOTE — ED Notes (Signed)
Pt returned to room from xray.

## 2016-09-02 NOTE — ED Triage Notes (Signed)
To ED via [facility] for eval of cp which increases with palpation. Denies nausea, vomiting, or sob. Pts program director is in the room and states the pain 'came up quickly' this am.

## 2016-09-02 NOTE — ED Provider Notes (Signed)
[facility]-EMERGENCY DEPT Provider Note   CSN: [id number] Arrival date & time: 09/02/16  1047     History   Chief Complaint Chief Complaint  Patient presents with  . Chest Pain    HPI [name] is a 51 y.o. male.  The history is provided by the patient and a caregiver.  Chest Pain   This is a new problem. The current episode started 1 to 2 hours ago. The problem occurs constantly. The problem has been gradually improving. The pain is associated with raising an arm. The pain is present in the substernal region. The pain is severe. The quality of the pain is described as pleuritic and stabbing. The pain does not radiate. Duration of episode(s) is 2 hours. The symptoms are aggravated by certain positions and deep breathing (Palpation). Pertinent negatives include no abdominal pain, no back pain, no cough, no fever, no palpitations, no shortness of breath and no vomiting. Treatments tried: Aspirin. The treatment provided mild relief. Risk factors include male gender and lack of exercise.  Pertinent negatives for past medical history include no seizures.    Past Medical History:  Diagnosis Date  . 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 Cerebral palsy (HCC)   . Constipation   . Epidermoid cyst of skin of chest 11/13/2015  . Psychotic disorder   . Schizoaffective disorder (HCC)    "mild"    Patient Active Problem List   Diagnosis Date Noted  . Epidermoid cyst of skin of chest 11/13/2015    Past Surgical History:  Procedure Laterality Date  . LEG AMPUTATION ABOVE KNEE Bilateral   . MASS EXCISION N/A 11/13/2015   Procedure: EXCISION BACK MASS 1.5 CM    (MINOR PROCEDURE) ;  Surgeon: Claud Kelp, MD;  Location: [facility] OR;  Service: General;  Laterality: N/A;       Home Medications    Prior to Admission medications   Medication Sig Start Date End Date Taking? Authorizing Provider  dextromethorphan (DELSYM) 30 MG/5ML liquid Take 60 mg by mouth 2  (two) times daily.   Yes Historical Provider, MD  FLUoxetine (PROZAC) 20 MG capsule Take 60 mg by mouth daily after breakfast.    Yes Historical Provider, MD  gabapentin (NEURONTIN) 100 MG capsule Take 200 mg by mouth at bedtime.   Yes Historical Provider, MD  gabapentin (NEURONTIN) 400 MG capsule Take 400 mg by mouth at bedtime.   Yes Historical Provider, MD  lubiprostone (AMITIZA) 24 MCG capsule Take 24 mcg by mouth 2 (two) times daily.   Yes Historical Provider, MD  meclizine (ANTIVERT) 25 MG tablet Take 25 mg by mouth at bedtime.   Yes Historical Provider, MD  metoCLOPramide (REGLAN) 5 MG tablet Take 5 mg by mouth 3 (three) times daily.    Yes Historical Provider, MD  OLANZapine (ZYPREXA) 20 MG tablet Take 20 mg by mouth at bedtime.    Yes Historical Provider, MD  Omega-3 Fatty Acids (FISH OIL) 1000 MG CAPS Take 1,000 mg by mouth 2 (two) times daily.   Yes Historical Provider, MD  omeprazole (PRILOSEC) 20 MG capsule Take 20 mg by mouth 2 (two) times daily.   Yes Historical Provider, MD  pravastatin (PRAVACHOL) 40 MG tablet Take 40 mg by mouth at bedtime.   Yes Historical Provider, MD  vitamin C (ASCORBIC ACID) 500 MG tablet Take 500 mg by mouth daily.   Yes Historical Provider, MD  vitamin E 400 UNIT capsule Take 400 Units by mouth daily.  Yes Historical Provider, MD  ibuprofen (ADVIL,MOTRIN) 600 MG tablet Take 1 tablet (600 mg total) by mouth every 6 (six) hours as needed for mild pain or moderate pain. 09/02/16 09/09/16  Lennette BihariJohn Michael Millisa Giarrusso, MD    Family History Family History  Problem Relation Age of Onset  . Hypertension Father     Social History Social History  Substance Use Topics  . Smoking status: Never Smoker  . Smokeless tobacco: Not on file  . Alcohol use No     Allergies   Patient has no known allergies.   Review of Systems Review of Systems  Constitutional: Negative for chills and fever.  HENT: Negative for ear pain and sore throat.   Eyes: Negative for pain and  visual disturbance.  Respiratory: Negative for cough and shortness of breath.   Cardiovascular: Positive for chest pain. Negative for palpitations.  Gastrointestinal: Negative for abdominal pain and vomiting.  Genitourinary: Negative for dysuria and hematuria.  Musculoskeletal: Negative for arthralgias and back pain.  Skin: Negative for color change and rash.  Neurological: Negative for seizures and syncope.  All other systems reviewed and are negative.    Physical Exam Updated Vital Signs BP 102/79   Pulse 66   Resp 20   SpO2 96%   Physical Exam  Constitutional: He is oriented to person, place, and time. He appears well-developed and well-nourished.  HENT:  Head: Normocephalic and atraumatic.  Eyes: Conjunctivae are normal.  Neck: Neck supple.  Cardiovascular: Normal rate and regular rhythm.   No murmur heard. Pulmonary/Chest: Effort normal and breath sounds normal. No respiratory distress. He exhibits tenderness.  Tenderness to palpation of sternum. No skin changes.   Abdominal: Soft. He exhibits no distension and no mass. There is no tenderness. There is no rebound and no guarding.  Musculoskeletal: He exhibits deformity. He exhibits no edema.  Chronic contractures to bilateral upper extremities. Bilateral above knee amputations.  Neurological: He is alert and oriented to person, place, and time. No cranial nerve deficit.  Skin: Skin is warm and dry.  Psychiatric: He has a normal mood and affect.  Nursing note and vitals reviewed.    ED Treatments / Results  Labs (all labs ordered are listed, but only abnormal results are displayed) Labs Reviewed  CBC WITH DIFFERENTIAL/PLATELET - Abnormal; Notable for the following:       Result Value   MCV 100.9 (*)    All other components within normal limits  COMPREHENSIVE METABOLIC PANEL - Abnormal; Notable for the following:    Creatinine, Ser 0.56 (*)    All other components within normal limits  LIPASE, BLOOD  I-STAT  TROPOININ, ED  I-STAT TROPOININ, ED    EKG  EKG Interpretation  Date/Time:  Monday September 02 2016 10:56:28 EST Ventricular Rate:  75 PR Interval:    QRS Duration: 103 QT Interval:  412 QTC Calculation: 461 R Axis:   45 Text Interpretation:  Sinus rhythm Abnormal R-wave progression, early transition Nonspecific T abnormalities, lateral leads No significant change since last tracing Confirmed by [facility]ARD[facility] MD, [facility]EDRO ([id number]) on 09/02/2016 11:00:15 AM       Radiology Dg Chest 2 View  Result Date: 09/02/2016 CLINICAL DATA:  Chest pain for several hours EXAM: CHEST  2 VIEW COMPARISON:  10/13/2008 FINDINGS: Cardiac shadows within normal limits. Previously seen PICC line is been removed in the interval. The lungs are hypoinflated but clear. No focal infiltrate or sizable effusion is seen. No bony abnormality is noted. IMPRESSION: No active cardiopulmonary disease.  Electronically Signed   By: Alcide Clever M.D.   On: 09/02/2016 12:15    Procedures Procedures (including critical care time)  Medications Ordered in ED Medications  ketorolac (TORADOL) 15 MG/ML injection 15 mg (15 mg Intravenous Not Given 09/02/16 1500)     Initial Impression / Assessment and Plan / ED Course  I have reviewed the triage vital signs and the nursing notes.  Pertinent labs & imaging results that were available during my care of the patient were reviewed by me and considered in my medical decision making (see chart for details).    Patient is a 51 year old male who presents with chest pain. His chest pain started this morning at 9 AM. His pain worsened for about 2 hours. It improved with aspirin given by EMS. He has a prior history of diabetes, but is no longer on medications. His pain is nonexertional and is reproducible on palpation about his mid chest. He is accompanied today by a caregiver at his group home. The caregiver reports that the patient requires a lot of assistance with movement and may have obtained a  muscle strain from this. He is HEART score is 3. Do not suspect ACS. Delta troponin is negative here. EKG with nonspecific TW flattening, but no significant changes from prior. Pain resolved here with Toradol. Strongly suspect musculoskeletal chest pain. Plan is for conservative treatment with NSAIDs. Plan for PCP f/u. Return precautions discussed in detail.  Final Clinical Impressions(s) / ED Diagnoses   Final diagnoses:  Chest wall pain    New Prescriptions Discharge Medication List as of 09/02/2016  3:44 PM    START taking these medications   Details  ibuprofen (ADVIL,MOTRIN) 600 MG tablet Take 1 tablet (600 mg total) by mouth every 6 (six) hours as needed for mild pain or moderate pain., Starting Mon 09/02/2016, Until Mon 09/09/2016, Print         Lennette Bihari, MD 09/02/16 [phone]

## 2016-09-02 NOTE — ED Provider Notes (Signed)
I have personally seen and examined the patient. I have reviewed the documentation on PMH/FH/Soc Hx. I have discussed the plan of care with the resident and patient.  I have reviewed and agree with the resident's documentation. Please see associated encounter note.   EKG Interpretation  Date/Time:  Monday September 02 2016 10:56:28 EST Ventricular Rate:  75 PR Interval:    QRS Duration: 103 QT Interval:  412 QTC Calculation: 461 R Axis:   45 Text Interpretation:  Sinus rhythm Abnormal R-wave progression, early transition Nonspecific T abnormalities, lateral leads No significant change since last tracing Confirmed by [facility]ARD[facility] MD, PEDRO ([id number]) on 09/02/2016 11:00:15 AM         Nira ConnPedro Eduardo Cardama, MD 09/02/16 1534

## 2016-09-02 NOTE — ED Triage Notes (Signed)
Full ASA given to pt prior to EMS arrival. Pt ate breakfast this am without problems.

## 2017-01-20 ENCOUNTER — Emergency Department (HOSPITAL_COMMUNITY): Payer: Medicare Other

## 2017-01-20 ENCOUNTER — Emergency Department (HOSPITAL_COMMUNITY)
Admission: EM | Admit: 2017-01-20 | Discharge: 2017-01-20 | Disposition: A | Discharge status: Home / Self Care | Payer: Medicare Other | Attending: Emergency Medicine | Admitting: Emergency Medicine

## 2017-01-20 ENCOUNTER — Encounter (HOSPITAL_COMMUNITY): Payer: Self-pay | Admitting: Emergency Medicine

## 2017-01-20 DIAGNOSIS — Z79899 Other long term (current) drug therapy: Secondary | ICD-10-CM | POA: Insufficient documentation

## 2017-01-20 DIAGNOSIS — R0789 Other chest pain: Secondary | ICD-10-CM | POA: Diagnosis not present

## 2017-01-20 LAB — POCT I-STAT TROPONIN I
Troponin i, poc: 0.01 ng/mL (ref 0.00–0.08)
Troponin i, poc: 0.01 ng/mL (ref 0.00–0.08)

## 2017-01-20 LAB — BASIC METABOLIC PANEL
Anion gap: 8 (ref 5–15)
BUN: 14 mg/dL (ref 6–20)
CALCIUM: 9.1 mg/dL (ref 8.9–10.3)
CHLORIDE: 106 mmol/L (ref 101–111)
CO2: 24 mmol/L (ref 22–32)
Creatinine, Ser: 0.45 mg/dL — ABNORMAL LOW (ref 0.61–1.24)
GFR calc Af Amer: >60 mL/min (ref >60)
GFR calc non Af Amer: >60 mL/min (ref >60)
GLUCOSE: 98 mg/dL (ref 65–99)
Potassium: 3.7 mmol/L (ref 3.5–5.1)
Sodium: 138 mmol/L (ref 135–145)

## 2017-01-20 LAB — CBC
HCT: 43.4 % (ref 39.0–52.0)
Hemoglobin: 14.9 g/dL (ref 13.0–17.0)
MCH: 34.3 pg — AB (ref 26.0–34.0)
MCHC: 34.3 g/dL (ref 30.0–36.0)
MCV: 100 fL (ref 78.0–100.0)
Platelets: 217 10*3/uL (ref 150–400)
RBC: 4.34 MIL/uL (ref 4.22–5.81)
RDW: 13 % (ref 11.5–15.5)
WBC: 10.3 10*3/uL (ref 4.0–10.5)

## 2017-01-20 MED ORDER — HYDROCODONE-ACETAMINOPHEN 5-325 MG PO TABS
1.0000 | ORAL_TABLET | Freq: Once | ORAL | Status: AC
  Administered 2017-01-20: 1 via ORAL
  Filled 2017-01-20: qty 1

## 2017-01-20 NOTE — Discharge Instructions (Signed)
Your EKG, Chest xray and blood work was negative. Please take Tylenol and/or Motrin for pain. Please follow up with your PCP this week for follow up.   Get help right away if: Your chest pain is worse. You have a cough that gets worse, or you cough up blood. You have very bad (severe) pain in your belly (abdomen). You are very weak. You pass out (faint). You have either of these for no clear reason: Sudden chest discomfort. Sudden discomfort in your arms, back, neck, or jaw. You have shortness of breath at any time. You suddenly start to sweat, or your skin gets clammy. You feel sick to your stomach (nauseous). You throw up (vomit). You suddenly feel light-headed or dizzy. Your heart starts to beat fast, or it feels like it is skipping beats.

## 2017-01-20 NOTE — ED Triage Notes (Signed)
Pt c/o central aching chest pain, bilateral chest pressure, and dizziness onset today at 0945. No SOB, blurred vision, nausea.

## 2017-01-20 NOTE — ED Provider Notes (Signed)
[facility]-EMERGENCY DEPT Provider Note   CSN: [id number] Arrival date & time: 01/20/17  1025     History   Chief Complaint Chief Complaint  Patient presents with  . Chest Pain    HPI [name] is a 51 y.o. 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 The patient states that he was resting in his wheelchair when he felt the pain onset on the left side. He states that this was a sharp/achy chest pain, and then moved to the right side and continued as bilateral chest pressure. This was associated with mild dizziness which the patient describes as "feeling lightheaded". He rates his pain as a 8/10. The symptoms resolved after 5 minutes with rest. He did not try anything for this (no asa or nitroglycerin use). The pain did not radiate to the neck, jaw, either arms. There is no associated nausea, vomiting, diaphoresis with this. The patient states that he had another episode of this while in the waiting room around 11 AM. This was exactly the same as the prior episode and again did not last longer than 5 minutes and was relieved with rest. The patient notes that this is somewhat similar to when he presented back in March 2018 for chest pain and was diagnosed with chest wall pain with a negative workup for ACS. The patient does not have a history of MI or stroke. The family history of MI or stroke. The patient has never had a cardiac procedure in the past.    HPI  Past Medical History:  Diagnosis Date  . 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 Cerebral palsy (HCC)   . Constipation   . Epidermoid cyst of skin of chest 11/13/2015  . Psychotic disorder   . Schizoaffective disorder (HCC)    "mild"    Patient Active Problem List   Diagnosis Date Noted  . Epidermoid cyst of skin of chest 11/13/2015    Past  Surgical History:  Procedure Laterality Date  . LEG AMPUTATION ABOVE KNEE Bilateral   . MASS EXCISION N/A 11/13/2015   Procedure: EXCISION BACK MASS 1.5 CM    (MINOR PROCEDURE) ;  Surgeon: Claud Kelp, MD;  Location: [facility] OR;  Service: General;  Laterality: N/A;       Home Medications    Prior to Admission medications   Medication Sig Start Date End Date Taking? Authorizing Provider  dextromethorphan (DELSYM) 30 MG/5ML liquid Take 60 mg by mouth 2 (two) times daily.    [provider]  FLUoxetine (PROZAC) 20 MG capsule Take 60 mg by mouth daily after breakfast.     [provider]  gabapentin (NEURONTIN) 100 MG capsule Take 200 mg by mouth at bedtime.    [provider]  gabapentin (NEURONTIN) 400 MG capsule Take 400 mg by mouth at bedtime.    [provider]  lubiprostone (AMITIZA) 24 MCG capsule Take 24 mcg by mouth 2 (two) times daily.    [provider]  meclizine (ANTIVERT) 25 MG tablet Take 25 mg by mouth at bedtime.    [provider]  metoCLOPramide (REGLAN) 5 MG tablet Take 5 mg by mouth 3 (three) times daily.     [provider]  OLANZapine (ZYPREXA) 20 MG tablet Take 20 mg by mouth at bedtime.     [provider]  Omega-3 Fatty  Acids (FISH OIL) 1000 MG CAPS Take 1,000 mg by mouth 2 (two) times daily.    [provider]  omeprazole (PRILOSEC) 20 MG capsule Take 20 mg by mouth 2 (two) times daily.    [provider]  pravastatin (PRAVACHOL) 40 MG tablet Take 40 mg by mouth at bedtime.    [provider]  vitamin C (ASCORBIC ACID) 500 MG tablet Take 500 mg by mouth daily.    [provider]  vitamin E 400 UNIT capsule Take 400 Units by mouth daily.    [provider]    Family History Family History  Problem Relation Age of Onset  . Hypertension Father     Social History Social History  Substance Use Topics  . Smoking status: Never Smoker  . Smokeless  tobacco: Not on file  . Alcohol use No     Allergies   Patient has no known allergies.   Review of Systems Review of Systems  All other systems reviewed and are negative.    Physical Exam Updated Vital Signs BP (!) 113/96   Pulse 79   Temp 98.3 F (36.8 C) (Oral)   Resp 18   SpO2 98%   Physical Exam  Constitutional: He appears well-developed and well-nourished.  HENT:  Head: Normocephalic and atraumatic.  Right Ear: External ear normal.  Left Ear: External ear normal.  Nose: Nose normal.  Mouth/Throat: Oropharynx is clear and moist.  Eyes: Pupils are equal, round, and reactive to light. Right eye exhibits no discharge. Left eye exhibits no discharge. No scleral icterus.  Neck: Normal range of motion. Neck supple. Carotid bruit is not present.  Cardiovascular: Normal rate, regular rhythm and intact distal pulses.   No murmur heard. Pulses:      Radial pulses are 2+ on the right side, and 2+ on the left side.       Femoral pulses are 2+ on the right side, and 2+ on the left side. No leg extremity swelling or edema of patients remaining legs.   Pulmonary/Chest: Effort normal and breath sounds normal. He exhibits no tenderness.  Abdominal: Soft. Bowel sounds are normal. There is no tenderness. There is no rebound and no guarding.  Musculoskeletal: He exhibits no edema.  Patient hands contracted across chest. States this is chronic for him. Bilateral leg amputation, above knee noted.   Lymphadenopathy:    He has no cervical adenopathy.  Neurological: He is alert.  Skin: Skin is warm and dry. Capillary refill takes less than 2 seconds. No rash noted. He is not diaphoretic.  Psychiatric: He has a normal mood and affect.  Nursing note and vitals reviewed.    ED Treatments / Results  Labs (all labs ordered are listed, but only abnormal results are displayed) Labs Reviewed  BASIC METABOLIC PANEL - Abnormal; Notable for the following:       Result Value   Creatinine,  Ser 0.45 (*)    All other components within normal limits  CBC - Abnormal; Notable for the following:    MCH 34.3 (*)    All other components within normal limits  I-STAT TROPONIN, ED  POCT I-STAT TROPONIN I  I-STAT TROPONIN, ED  POCT I-STAT TROPONIN I    EKG  EKG Interpretation  Date/Time:  Monday January 20 2017 10:44:13 EDT Ventricular Rate:  81 PR Interval:    QRS Duration: 87 QT Interval:  386 QTC Calculation: 448 R Axis:   16 Text Interpretation:  Sinus rhythm Borderline T wave  abnormalities No significant change since last tracing Confirmed by Cathren Laine ([id number]) on 01/20/2017 3:26:13 PM       Radiology Dg Chest 2 View  Result Date: 01/20/2017 CLINICAL DATA:  Centralized chest pain. EXAM: CHEST  2 VIEW COMPARISON:  Chest radiograph 09/02/2016 FINDINGS: Patient is rotated, limiting evaluation. Stable cardiac and mediastinal contours. Low lung volumes. Left-greater-than-right basilar heterogeneous pulmonary opacities. No pleural effusion or pneumothorax. Thoracic spine degenerative changes. Gas distended colon. IMPRESSION: Low lung volumes with left-greater-than-right basilar opacities favored represent atelectasis. Infection not excluded. Gaseous distended colon. Electronically Signed   By: Annia Belt M.D.   On: 01/20/2017 11:33    Procedures Procedures (including critical care time)  Medications Ordered in ED Medications  HYDROcodone-acetaminophen (NORCO/VICODIN) 5-325 MG per tablet 1 tablet (1 tablet Oral Given 01/20/17 1636)     Initial Impression / Assessment and Plan / ED Course  I have reviewed the triage vital signs and the nursing notes.  Pertinent labs & imaging results that were available during my care of the patient were reviewed by me and considered in my medical decision making (see chart for details).     51 year old male presenting today for chest pain x 2 occurrences today. Both instances occur during rest, lasted less than 5 minutes, relieved with  rest. There is no associated radiation to the neck, jaw, either arm. No nausea, diaphoresis associated with this. The patient's vital signs on presentation were reassuring and he is nontoxic appearing. During exam and history he is chest pain-free. There was noted tenderness across the chest with palpation. Otherwise exam relatively unremarkable. Chest pain workup initiated.   Patient is to be discharged with recommendation to follow up with PCP in regards to today's hospital visit. Chest pain is not likely of cardiac or pulmonary etiology d/t presentation, perc negative, VSS, no tracheal deviation, no JVD or new murmur, RRR, breath sounds equal bilaterally, EKG without acute abnormalities, delta negative troponin x2, and negative CXR (there was question of infection. Discussed this with Dr. Denton Lank and with clinical picture did not feel this was clinically significant).Patient heart score 3 which places the patient at low risk. Pt has been advised start a PPI and return to the ED is CP becomes exertional, associated with diaphoresis or nausea, radiates to left jaw/arm, worsens or becomes concerning in any way. Pt appears reliable for follow up and is agreeable to discharge.   Case has been discussed with and seen by Dr. Denton Lank who agrees with the above plan to discharge.    Final Clinical Impressions(s) / ED Diagnoses   Final diagnoses:  Other chest pain    New Prescriptions Discharge Medication List as of 01/20/2017  5:01 PM       Jacinto Halim, PA-C 01/20/17 2249    Cathren Laine, MD 01/21/17 [phone]

## 2017-01-20 NOTE — ED Notes (Signed)
Pt verbalized understanding of discharge instructions and denies any further questions at this time.   

## 2017-05-18 IMAGING — CR DG THORACIC SPINE 2V
3 series · 3 of 3 positions shown · non-contrast
Comparison: None.

CLINICAL DATA: Fall backwards out of wheelchair with back pain.
Initial encounter.

EXAM:
THORACIC SPINE 2 VIEWS

[t thoracic spine ap]
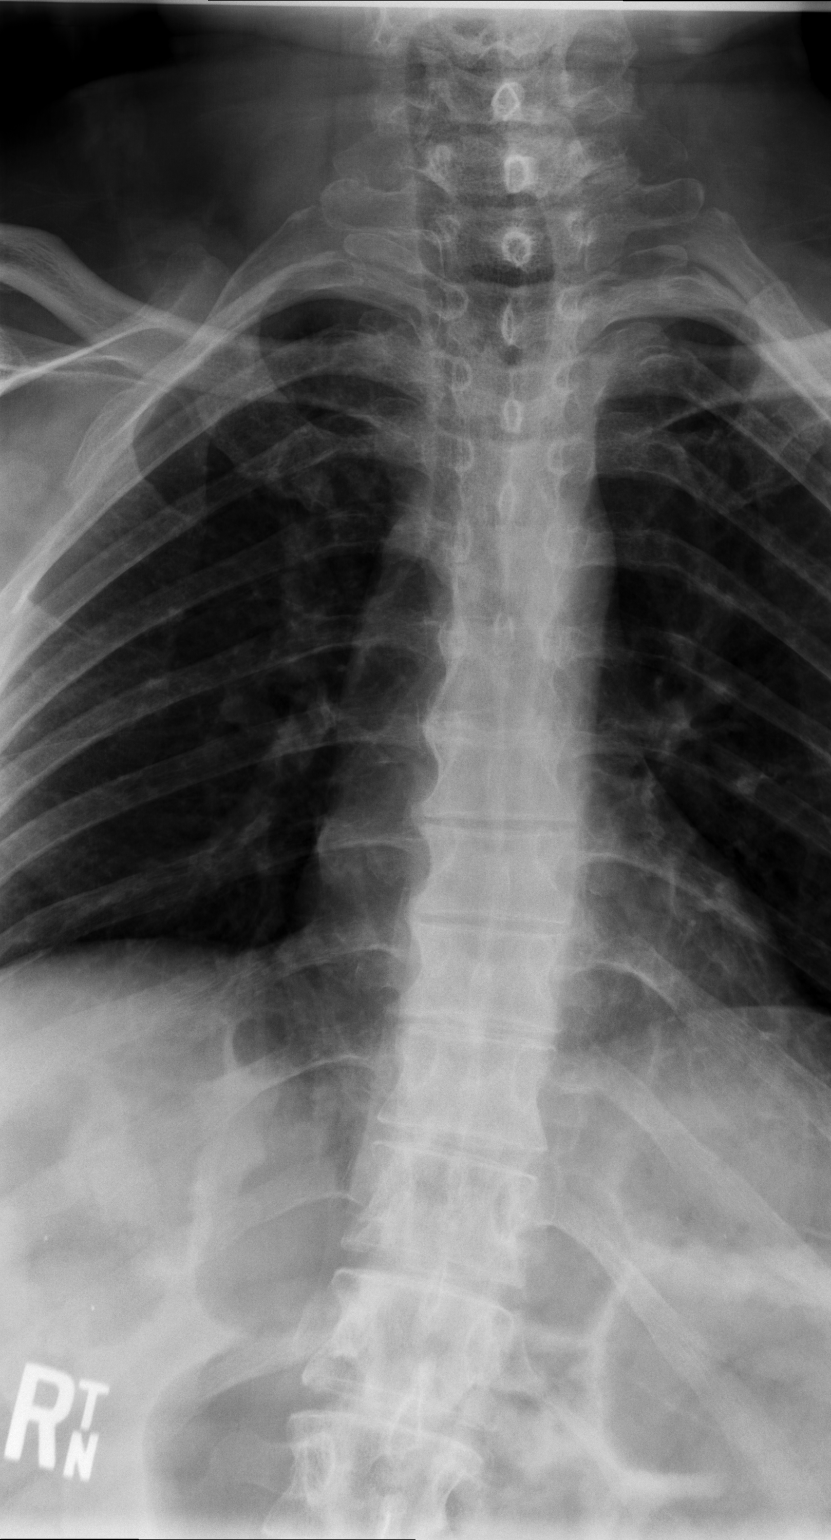

[t thoracic spine lat]
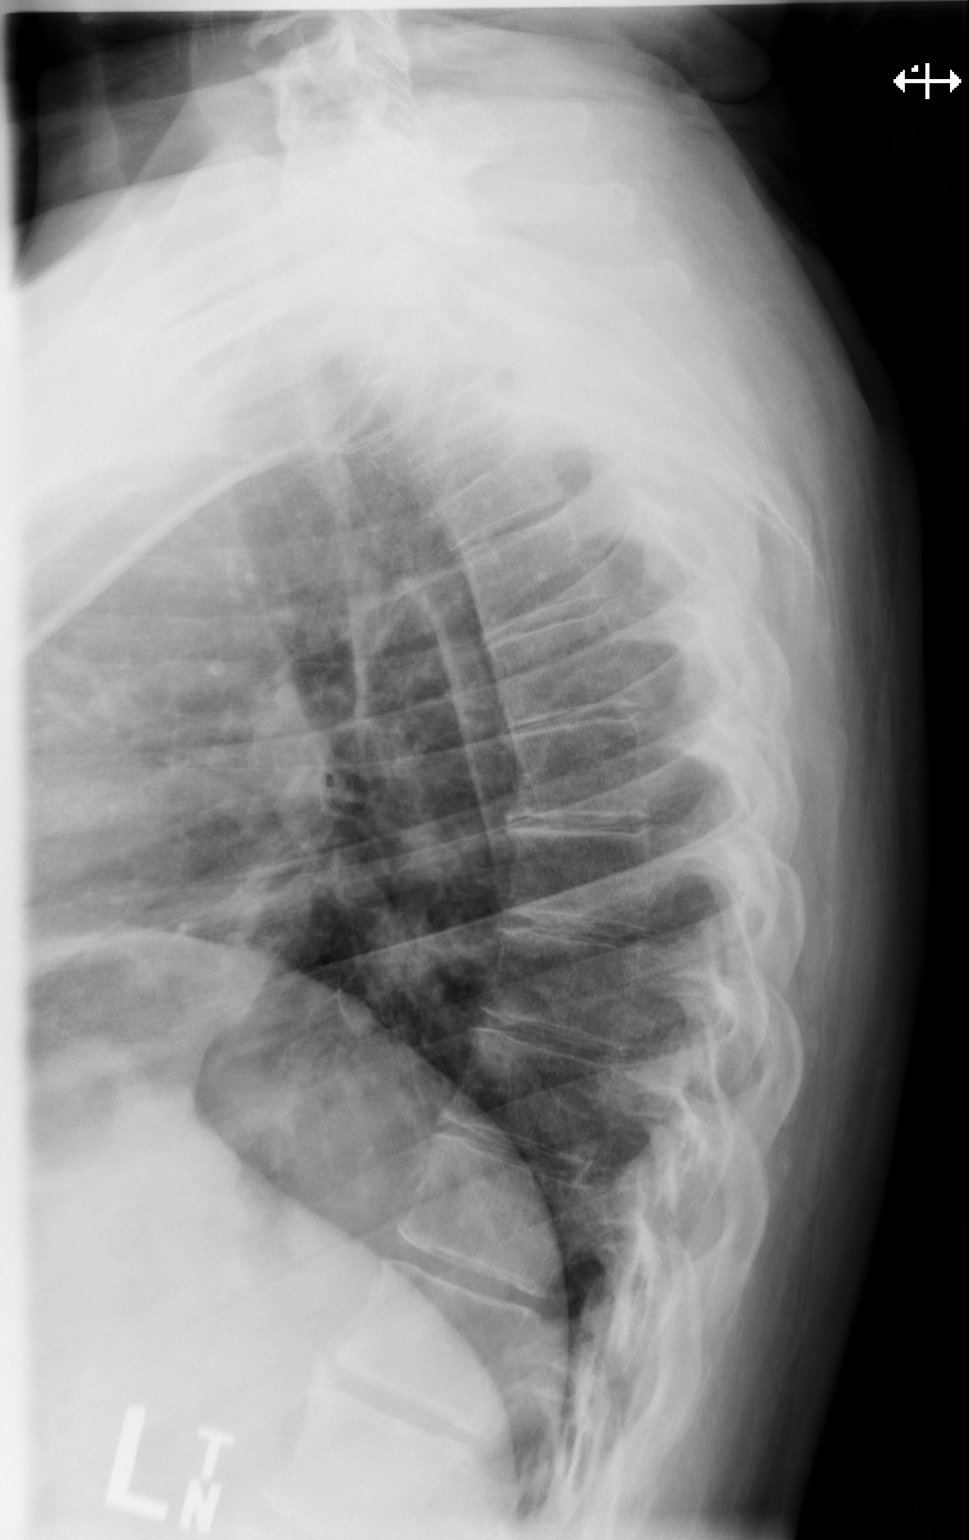

[t swimmers]
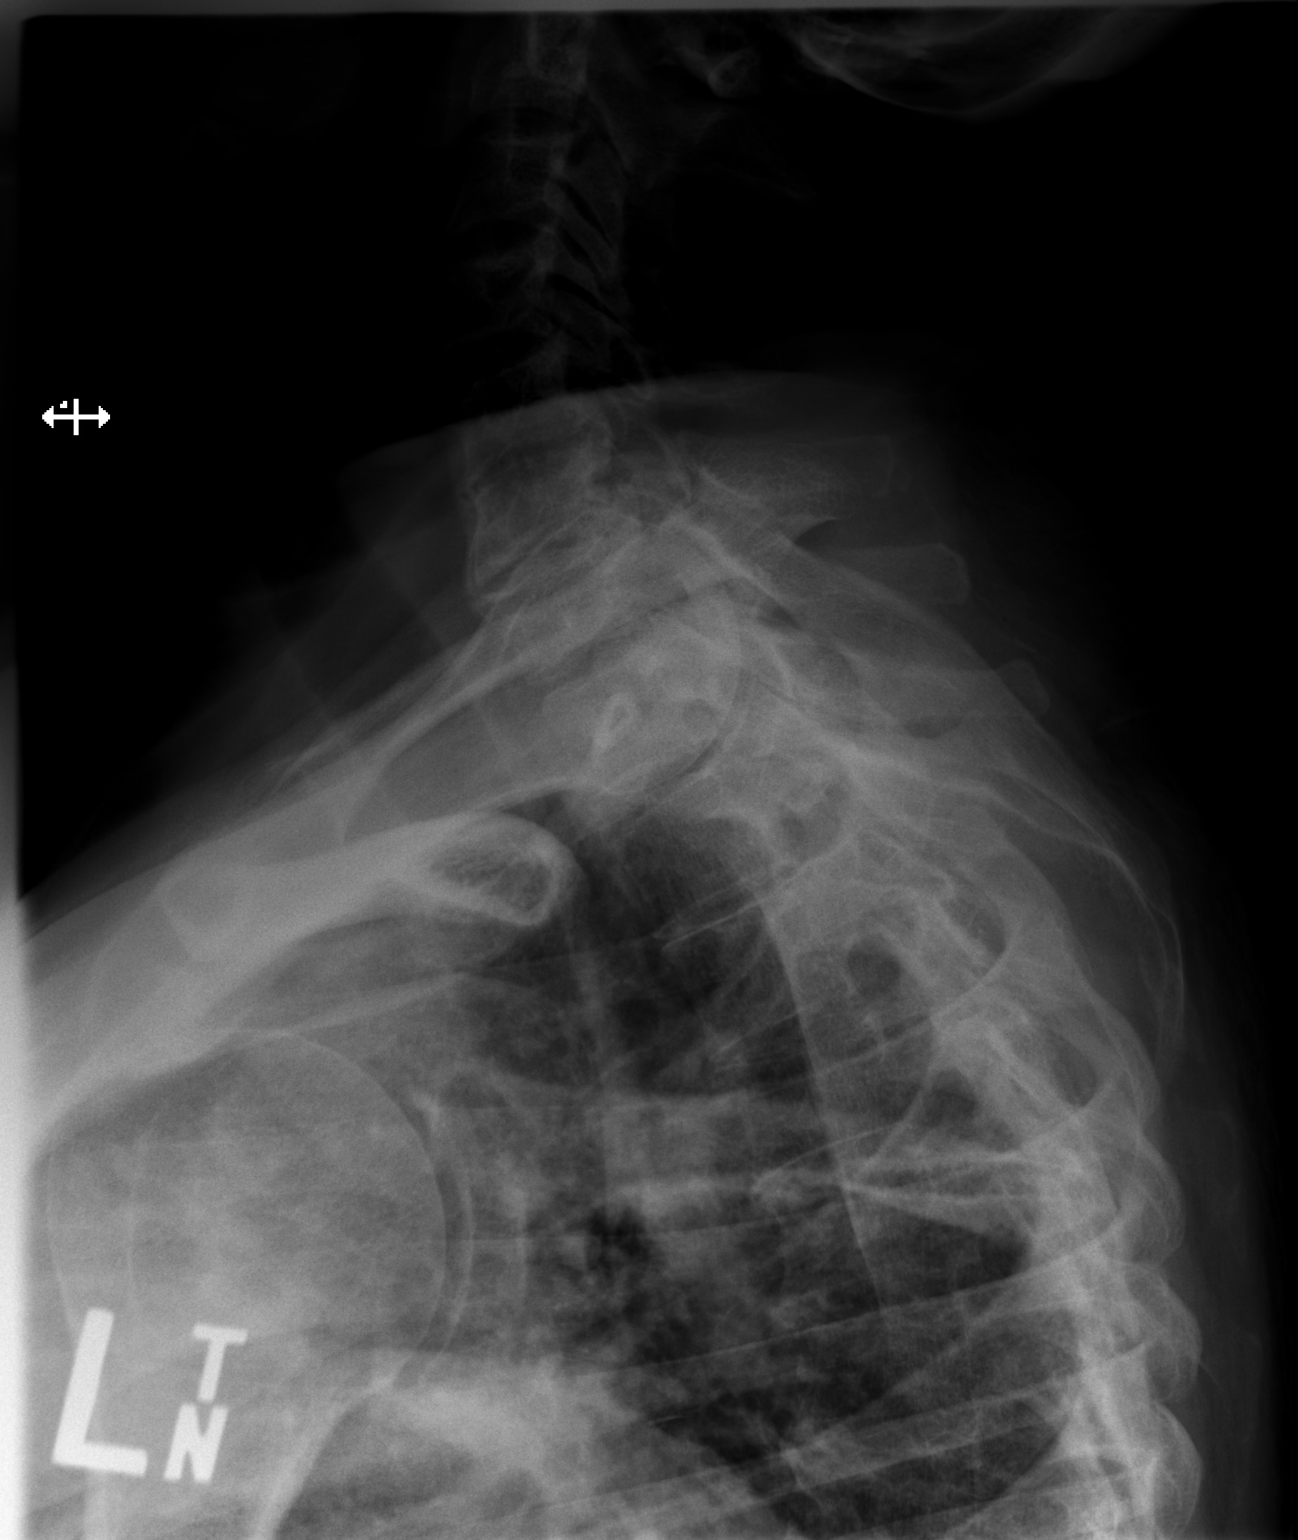

[3 of 3 positions shown; findings below may reference images not displayed]

FINDINGS: There is no evidence of thoracic fracture or subluxation. Mild
spondylosis noted. No bony lesions are seen.
IMPRESSION: No evidence of thoracic spine fracture.

## 2017-07-24 ENCOUNTER — Emergency Department (HOSPITAL_COMMUNITY): Payer: Medicare Other

## 2017-07-24 ENCOUNTER — Other Ambulatory Visit: Payer: Self-pay

## 2017-07-24 ENCOUNTER — Encounter (HOSPITAL_COMMUNITY): Payer: Self-pay | Admitting: Pharmacy Technician

## 2017-07-24 ENCOUNTER — Emergency Department (HOSPITAL_COMMUNITY)
Admission: EM | Admit: 2017-07-24 | Discharge: 2017-07-24 | Disposition: A | Discharge status: Home / Self Care | Payer: Medicare Other | Attending: Emergency Medicine | Admitting: Emergency Medicine

## 2017-07-24 DIAGNOSIS — J40 Bronchitis, not specified as acute or chronic: Secondary | ICD-10-CM

## 2017-07-24 DIAGNOSIS — Z79899 Other long term (current) drug therapy: Secondary | ICD-10-CM | POA: Diagnosis not present

## 2017-07-24 DIAGNOSIS — R059 Cough, unspecified: Secondary | ICD-10-CM

## 2017-07-24 DIAGNOSIS — K529 Noninfective gastroenteritis and colitis, unspecified: Secondary | ICD-10-CM

## 2017-07-24 DIAGNOSIS — F259 Schizoaffective disorder, unspecified: Secondary | ICD-10-CM | POA: Diagnosis not present

## 2017-07-24 DIAGNOSIS — R0789 Other chest pain: Secondary | ICD-10-CM | POA: Insufficient documentation

## 2017-07-24 DIAGNOSIS — K5282 Eosinophilic colitis: Secondary | ICD-10-CM | POA: Insufficient documentation

## 2017-07-24 DIAGNOSIS — R079 Chest pain, unspecified: Secondary | ICD-10-CM | POA: Diagnosis present

## 2017-07-24 DIAGNOSIS — G809 Cerebral palsy, unspecified: Secondary | ICD-10-CM | POA: Diagnosis not present

## 2017-07-24 DIAGNOSIS — R05 Cough: Secondary | ICD-10-CM

## 2017-07-24 LAB — CBC WITH DIFFERENTIAL/PLATELET
Basophils Absolute: 0 10*3/uL (ref 0.0–0.1)
Basophils Relative: 0 %
Eosinophils Absolute: 0.1 10*3/uL (ref 0.0–0.7)
Eosinophils Relative: 1 %
HCT: 47 % (ref 39.0–52.0)
Hemoglobin: 16.1 g/dL (ref 13.0–17.0)
Lymphocytes Relative: 22 %
Lymphs Abs: 1.7 10*3/uL (ref 0.7–4.0)
MCH: 34.8 pg — ABNORMAL HIGH (ref 26.0–34.0)
MCHC: 34.3 g/dL (ref 30.0–36.0)
MCV: 101.5 fL — ABNORMAL HIGH (ref 78.0–100.0)
Monocytes Absolute: 0.5 10*3/uL (ref 0.1–1.0)
Monocytes Relative: 6 %
Neutro Abs: 5.4 10*3/uL (ref 1.7–7.7)
Neutrophils Relative %: 71 %
Platelets: 209 10*3/uL (ref 150–400)
RBC: 4.63 MIL/uL (ref 4.22–5.81)
RDW: 13.3 % (ref 11.5–15.5)
WBC: 7.6 10*3/uL (ref 4.0–10.5)

## 2017-07-24 LAB — COMPREHENSIVE METABOLIC PANEL
ALT: 13 U/L — ABNORMAL LOW (ref 17–63)
AST: 22 U/L (ref 15–41)
Albumin: 3.9 g/dL (ref 3.5–5.0)
Alkaline Phosphatase: 77 U/L (ref 38–126)
Anion gap: 12 (ref 5–15)
BUN: 17 mg/dL (ref 6–20)
CO2: 20 mmol/L — ABNORMAL LOW (ref 22–32)
Calcium: 9.5 mg/dL (ref 8.9–10.3)
Chloride: 109 mmol/L (ref 101–111)
Creatinine, Ser: 0.58 mg/dL — ABNORMAL LOW (ref 0.61–1.24)
GFR calc Af Amer: >60 mL/min (ref >60)
GFR calc non Af Amer: >60 mL/min (ref >60)
Glucose, Bld: 89 mg/dL (ref 65–99)
Potassium: 4.6 mmol/L (ref 3.5–5.1)
Sodium: 141 mmol/L (ref 135–145)
Total Bilirubin: 0.6 mg/dL (ref 0.3–1.2)
Total Protein: 7.1 g/dL (ref 6.5–8.1)

## 2017-07-24 LAB — URINALYSIS, ROUTINE W REFLEX MICROSCOPIC
Bilirubin Urine: NEGATIVE
Glucose, UA: NEGATIVE mg/dL
Hgb urine dipstick: NEGATIVE
Ketones, ur: 5 mg/dL — AB
Leukocytes, UA: NEGATIVE
Nitrite: NEGATIVE
Protein, ur: NEGATIVE mg/dL
Specific Gravity, Urine: 1.036 — ABNORMAL HIGH (ref 1.005–1.030)
pH: 5 (ref 5.0–8.0)

## 2017-07-24 LAB — I-STAT CG4 LACTIC ACID, ED: Lactic Acid, Venous: 1.59 mmol/L (ref 0.5–1.9)

## 2017-07-24 LAB — LIPASE, BLOOD: Lipase: 25 U/L (ref 11–51)

## 2017-07-24 LAB — I-STAT TROPONIN, ED
Troponin i, poc: 0 ng/mL (ref 0.00–0.08)
Troponin i, poc: 0 ng/mL (ref 0.00–0.08)

## 2017-07-24 MED ORDER — METRONIDAZOLE 500 MG PO TABS: 500.0000 mg | ORAL_TABLET | Freq: Two times a day (BID) | ORAL | 0 refills | Status: AC

## 2017-07-24 MED ORDER — ACETAMINOPHEN 325 MG PO TABS
650.0000 mg | ORAL_TABLET | Freq: Once | ORAL | Status: DC
Start: 2017-07-24 — End: 2017-07-24

## 2017-07-24 MED ORDER — IOPAMIDOL (ISOVUE-370) INJECTION 76%
INTRAVENOUS | Status: AC
  Administered 2017-07-24: 100 mL
  Filled 2017-07-24: qty 100

## 2017-07-24 MED ORDER — BENZONATATE 100 MG PO CAPS: 100.0000 mg | ORAL_CAPSULE | Freq: Three times a day (TID) | ORAL | 0 refills | Status: AC

## 2017-07-24 MED ORDER — METRONIDAZOLE 500 MG PO TABS
500.0000 mg | ORAL_TABLET | Freq: Once | ORAL | Status: AC
  Administered 2017-07-24: 500 mg via ORAL
  Filled 2017-07-24: qty 1

## 2017-07-24 MED ORDER — ACETAMINOPHEN 325 MG PO TABS
650.0000 mg | ORAL_TABLET | Freq: Once | ORAL | Status: AC
  Administered 2017-07-24: 650 mg via ORAL
  Filled 2017-07-24: qty 2

## 2017-07-24 MED ORDER — SODIUM CHLORIDE 0.9 % IV BOLUS (SEPSIS)
1000.0000 mL | Freq: Once | INTRAVENOUS | Status: AC
  Administered 2017-07-24: 1000 mL via INTRAVENOUS

## 2017-07-24 MED ORDER — FLEET ENEMA 7-19 GM/118ML RE ENEM
1.0000 | ENEMA | Freq: Once | RECTAL | Status: AC
  Administered 2017-07-24: 1 via RECTAL
  Filled 2017-07-24: qty 1

## 2017-07-24 MED ORDER — CIPROFLOXACIN HCL 500 MG PO TABS
500.0000 mg | ORAL_TABLET | Freq: Once | ORAL | Status: AC
  Administered 2017-07-24: 500 mg via ORAL
  Filled 2017-07-24: qty 1

## 2017-07-24 MED ORDER — CIPROFLOXACIN HCL 500 MG PO TABS: 500.0000 mg | ORAL_TABLET | Freq: Two times a day (BID) | ORAL | 0 refills | Status: AC

## 2017-07-24 NOTE — ED Triage Notes (Signed)
Pt arrives via EMS with reports of cp worse with coughing, onset yesterday. Pt bp 119/78, HR 80, 98% RA.

## 2017-07-24 NOTE — ED Notes (Signed)
Patient transported to X-ray 

## 2017-07-24 NOTE — ED Provider Notes (Signed)
[facility] EMERGENCY DEPARTMENT Provider Note   CSN: [id number] Arrival date & time: 07/24/17  1105     History   Chief Complaint Chief Complaint  Patient presents with  . Chest Pain    HPI [name] is a 52 y.o. male with history of cerebral palsy, constipation, bilateral traumatic amputation of the legs, and schizoaffective disorder presents today with chief complaint acute onset, progressively worsening chest pain with cough and deep inspiration.  He states that pain began yesterday afternoon at rest.  Pain  is sharp and occurs only with cough and deep inspiration.  Pain is primarily substernal with radiation to the anterior chest wall bilaterally. Cough is nonproductive.  He notes that he had nasal congestion for a few minutes a few days ago but this has entirely resolved.  He denies sore throat.  He endorses shortness of breath, which worsens while talking. Endorses subjective fevers.  Also endorses feeling of abdominal distention.  States he had "a good bowel movement "earlier today but still feels distended.  Denies abdominal pain, nausea, vomiting, or urinary symptoms. Has not tried anything for his symptoms. No prior history of DVT/PE.  He is a non-smoker, does not drink alcohol, no excessive caffeine intake. Denies recent trauma.   The history is provided by the patient.    Past Medical History:  Diagnosis Date  . 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 Cerebral palsy (HCC)   . Constipation   . Epidermoid cyst of skin of chest 11/13/2015  . Psychotic disorder (HCC)   . Schizoaffective disorder (HCC)    "mild"    Patient Active Problem List   Diagnosis Date Noted  . Epidermoid cyst of skin of chest 11/13/2015    Past Surgical History:  Procedure Laterality Date  . LEG AMPUTATION ABOVE KNEE Bilateral   . MASS EXCISION N/A 11/13/2015   Procedure: EXCISION BACK MASS 1.5 CM    (MINOR PROCEDURE) ;  Surgeon: Claud Kelp, MD;  Location: [facility] OR;  Service: General;  Laterality: N/A;       Home Medications    Prior to Admission medications   Medication Sig Start Date End Date Taking? Authorizing Provider  benzonatate (TESSALON) 100 MG capsule Take 1 capsule (100 mg total) by mouth every 8 (eight) hours. 07/24/17   Srihitha Tagliaferri A, PA-C  ciprofloxacin (CIPRO) 500 MG tablet Take 1 tablet (500 mg total) by mouth every 12 (twelve) hours for 7 days. 07/24/17 07/31/17  Michela Pitcher A, PA-C  dextromethorphan (DELSYM) 30 MG/5ML liquid Take 60 mg by mouth 2 (two) times daily.    [provider]  FLUoxetine (PROZAC) 20 MG capsule Take 60 mg by mouth daily after breakfast.     [provider]  gabapentin (NEURONTIN) 100 MG capsule Take 200 mg by mouth at bedtime.    [provider]  gabapentin (NEURONTIN) 400 MG capsule Take 400 mg by mouth at bedtime.    [provider]  lubiprostone (AMITIZA) 24 MCG capsule Take 24 mcg by mouth 2 (two) times daily.    [provider]  meclizine (ANTIVERT) 25 MG tablet Take 25 mg by mouth at bedtime.    [provider]  metoCLOPramide (REGLAN) 5 MG tablet Take 5 mg by mouth 3 (three) times daily.     [provider]  metroNIDAZOLE (FLAGYL) 500 MG tablet Take 1 tablet (500 mg total) by mouth 2 (two) times daily for 7 days. 07/24/17 07/31/17  Amy Gothard A, PA-C  OLANZapine (ZYPREXA) 20 MG tablet Take 20 mg by mouth at bedtime.     [provider]  Omega-3 Fatty Acids (FISH OIL) 1000 MG CAPS Take 1,000 mg by mouth 2 (two) times daily.    [provider]  omeprazole (PRILOSEC) 20 MG capsule Take 20 mg by mouth 2 (two) times daily.    [provider]  pravastatin (PRAVACHOL) 40 MG tablet Take 40 mg by mouth at bedtime.    [provider]  vitamin C (ASCORBIC ACID) 500 MG tablet Take 500 mg by mouth daily.    [provider]  vitamin E 400 UNIT capsule Take 400 Units by mouth daily.     [provider]    Family History Family History  Problem Relation Age of Onset  . Hypertension Father     Social History Social History   Tobacco Use  . Smoking status: Never Smoker  Substance Use Topics  . Alcohol use: No  . Drug use: Not on file     Allergies   Patient has no known allergies.   Review of Systems Review of Systems  Constitutional: Positive for fever.  HENT: Negative for sore throat.   Respiratory: Positive for cough and shortness of breath.   Cardiovascular: Positive for chest pain.  Gastrointestinal: Positive for abdominal pain. Negative for constipation, diarrhea, nausea and vomiting.  Neurological: Negative for syncope.  All other systems reviewed and are negative.    Physical Exam Updated Vital Signs BP 113/70   Pulse 80   Temp 98.7 F (37.1 C) (Oral)   Resp 16   SpO2 97%   Physical Exam  Constitutional: He appears well-developed and well-nourished. No distress.  HENT:  Head: Normocephalic and atraumatic.  No frontal or maxillary sinus tenderness, posterior oropharynx without erythema, tonsillar hypertrophy, uvular deviation, or exudates.  Nasal septum midline without mucosal edema.  Eyes: Conjunctivae are normal. Right eye exhibits no discharge. Left eye exhibits no discharge.  Neck: Normal range of motion. Neck supple. No JVD present. No tracheal deviation present.  Cardiovascular: Normal rate and regular rhythm.  Pulses:      Radial pulses are 2+ on the right side, and 2+ on the left side.  Pulmonary/Chest: Effort normal and breath sounds normal.  Hypoactive breath sounds generally in the anterior lung fields, diffuse anterior chest wall tenderness to palpation with no deformity, crepitus, ecchymosis, or paradoxical wall motion  Abdominal: Soft. He exhibits distension. There is no guarding.  Hypoactive bowel sounds, generalized tenderness to palpation of the abdomen with no focal tenderness  Musculoskeletal: He exhibits no  edema.  Bilateral above-the-knee amputation, bilateral upper extremities contracted  Neurological: He is alert.  Skin: Skin is warm and dry. No erythema.  Small stage I decubitus ulcer to the right buttock, no surrounding erythema or drainage.  Psychiatric: He has a normal mood and affect. His behavior is normal.  Nursing note and vitals reviewed.    ED Treatments / Results  Labs (all labs ordered are listed, but only abnormal results are displayed) Labs Reviewed  COMPREHENSIVE METABOLIC PANEL - Abnormal; Notable for the following components:      Result Value   CO2 20 (*)    Creatinine, Ser 0.58 (*)    ALT 13 (*)    All other components within normal limits  CBC WITH DIFFERENTIAL/PLATELET - Abnormal; Notable for the following components:   MCV 101.5 (*)    MCH 34.8 (*)    All other  components within normal limits  URINALYSIS, ROUTINE W REFLEX MICROSCOPIC - Abnormal; Notable for the following components:   Color, Urine STRAW (*)    Specific Gravity, Urine 1.036 (*)    Ketones, ur 5 (*)    All other components within normal limits  CULTURE, BLOOD (ROUTINE X 2)  CULTURE, BLOOD (ROUTINE X 2)  URINE CULTURE  LIPASE, BLOOD  I-STAT TROPONIN, ED  I-STAT CG4 LACTIC ACID, ED  I-STAT CG4 LACTIC ACID, ED  I-STAT TROPONIN, ED    EKG  EKG Interpretation  Date/Time:  Thursday July 24 2017 11:10:23 EST Ventricular Rate:  88 PR Interval:    QRS Duration: 86 QT Interval:  387 QTC Calculation: 469 R Axis:   48 Text Interpretation:  Sinus rhythm Nonspecific T abnormalities, lateral leads No significant change since last tracing Confirmed by Richardean Canal [phone]) on 07/24/2017 11:42:10 AM       Radiology Dg Chest 1 View  Result Date: 07/24/2017 CLINICAL DATA:  52 year old male with cough and fever. EXAM: CHEST 1 VIEW COMPARISON:  Chest radiograph 01/20/2017 and earlier. FINDINGS: Semi upright AP view at 1235 hrs. Stable low lung volumes. Mediastinal contours remain normal.  Visualized tracheal air column is within normal limits. The lungs are clear. No pneumothorax or pleural effusion. Negative visible bowel gas pattern. No acute osseous abnormality identified. IMPRESSION: No acute cardiopulmonary abnormality. Electronically Signed   By: Odessa Fleming M.D.   On: 07/24/2017 12:51   Ct Angio Chest Pe W And/or Wo Contrast  Result Date: 07/24/2017 CLINICAL DATA:  Chest pain, worse with cough onset yesterday with abdominal pain as well and fever. EXAM: CT ANGIOGRAPHY CHEST CT ABDOMEN AND PELVIS WITH CONTRAST TECHNIQUE: Multidetector CT imaging of the chest was performed using the standard protocol during bolus administration of intravenous contrast. Multiplanar CT image reconstructions and MIPs were obtained to evaluate the vascular anatomy. Multidetector CT imaging of the abdomen and pelvis was performed using the standard protocol during bolus administration of intravenous contrast. CONTRAST:  ISOVUE-370 IOPAMIDOL (ISOVUE-370) INJECTION 76% COMPARISON:  12/30/2008 FINDINGS: CTA CHEST FINDINGS Cardiovascular: Normal branch pattern of the great vessels without occlusion or significant stenosis. Normal caliber aorta without aneurysm or dissection. Excellent opacification of the pulmonary arterial system to the segmental level without pulmonary embolus. Heart is normal in size. No pericardial effusion is noted. Mediastinum/Nodes: No enlarged mediastinal, hilar, or axillary lymph nodes. Thyroid gland, trachea, and esophagus demonstrate no significant findings. Lungs/Pleura: Mild peribronchial thickening. No pneumonic consolidation. Bibasilar dependent atelectasis. No dominant mass or pneumothorax. Musculoskeletal: No chest wall abnormality. No acute or significant osseous findings. Mild degenerate change along the dorsal spine. Review of the MIP images confirms the above findings. CT ABDOMEN and PELVIS FINDINGS Hepatobiliary: Colonic interposition is noted. Homogeneous enhancement of the  liver. Punctate density along the dependent wall of the gallbladder may represent a tiny stone or polyp. No secondary signs of acute cholecystitis. Pancreas: Normal Spleen: Normal Adrenals/Urinary Tract: Normal bilateral adrenal glands. Slightly striated nephrograms bilaterally is likely due to timing of contrast bolus, less likely pyelonephritis. Repeat delayed images through the kidneys demonstrate no persistent enhancement abnormality. Stomach/Bowel: Nondistended stomach with normal small bowel rotation. No small bowel dilatation or inflammation. A a large volume of retained stool is seen throughout the colon consistent with constipation with mild transmural thickening of the rectosigmoid that may reflect a stercoral colitis. Short segmental areas of luminal narrowing noted of the proximal distal sigmoid that may simply reflect areas of peristaltic change. Short segment stricture  believed less likely. No shouldering along their margins to suggest definite mass lesion. Numerous pill like hyperdense fragments are seen scattered within the abdomen but predominantly in the cecum and ascending colon. No bowel obstruction however is noted. Vascular/Lymphatic: No significant vascular findings are present. No enlarged abdominal or pelvic lymph nodes. Reproductive: Prostate is unremarkable. Other: No free air.  No abdominopelvic ascites. Musculoskeletal: No acute or significant osseous findings. Review of the MIP images confirms the above findings. IMPRESSION: CT chest: No acute pulmonary embolus. Mild peribronchial thickening likely representing bronchitic change. No pneumonia, CHF, effusion or pneumothorax. CT abdomen and pelvis: 1. Large volume of retained stool throughout the colon compatible with constipation. Small collection of non digested capsular fragments from medication noted in the cecum and ascending colon as well as a few others seen further distally within the colon. 2. Large stool ball within the  rectosigmoid with mild transmural thickening suspicious for stercoral proctocolitis. 3. Punctate nonobstructing calculus versus polyp within the gallbladder without secondary signs of acute cholecystitis. Electronically Signed   By: Tollie Ethavid  Kwon M.D.   On: 07/24/2017 14:45   Ct Abdomen Pelvis W Contrast  Result Date: 07/24/2017 CLINICAL DATA:  Chest pain, worse with cough onset yesterday with abdominal pain as well and fever. EXAM: CT ANGIOGRAPHY CHEST CT ABDOMEN AND PELVIS WITH CONTRAST TECHNIQUE: Multidetector CT imaging of the chest was performed using the standard protocol during bolus administration of intravenous contrast. Multiplanar CT image reconstructions and MIPs were obtained to evaluate the vascular anatomy. Multidetector CT imaging of the abdomen and pelvis was performed using the standard protocol during bolus administration of intravenous contrast. CONTRAST:  100mL ISOVUE-370 IOPAMIDOL (ISOVUE-370) INJECTION 76% COMPARISON:  12/30/2008 FINDINGS: CTA CHEST FINDINGS Cardiovascular: Normal branch pattern of the great vessels without occlusion or significant stenosis. Normal caliber aorta without aneurysm or dissection. Excellent opacification of the pulmonary arterial system to the segmental level without pulmonary embolus. Heart is normal in size. No pericardial effusion is noted. Mediastinum/Nodes: No enlarged mediastinal, hilar, or axillary lymph nodes. Thyroid gland, trachea, and esophagus demonstrate no significant findings. Lungs/Pleura: Mild peribronchial thickening. No pneumonic consolidation. Bibasilar dependent atelectasis. No dominant mass or pneumothorax. Musculoskeletal: No chest wall abnormality. No acute or significant osseous findings. Mild degenerate change along the dorsal spine. Review of the MIP images confirms the above findings. CT ABDOMEN and PELVIS FINDINGS Hepatobiliary: Colonic interposition is noted. Homogeneous enhancement of the liver. Punctate density along the dependent  wall of the gallbladder may represent a tiny stone or polyp. No secondary signs of acute cholecystitis. Pancreas: Normal Spleen: Normal Adrenals/Urinary Tract: Normal bilateral adrenal glands. Slightly striated nephrograms bilaterally is likely due to timing of contrast bolus, less likely pyelonephritis. Repeat delayed images through the kidneys demonstrate no persistent enhancement abnormality. Stomach/Bowel: Nondistended stomach with normal small bowel rotation. No small bowel dilatation or inflammation. A a large volume of retained stool is seen throughout the colon consistent with constipation with mild transmural thickening of the rectosigmoid that may reflect a stercoral colitis. Short segmental areas of luminal narrowing noted of the proximal distal sigmoid that may simply reflect areas of peristaltic change. Short segment stricture believed less likely. No shouldering along their margins to suggest definite mass lesion. Numerous pill like hyperdense fragments are seen scattered within the abdomen but predominantly in the cecum and ascending colon. No bowel obstruction however is noted. Vascular/Lymphatic: No significant vascular findings are present. No enlarged abdominal or pelvic lymph nodes. Reproductive: Prostate is unremarkable. Other: No free air.  No abdominopelvic  ascites. Musculoskeletal: No acute or significant osseous findings. Review of the MIP images confirms the above findings. IMPRESSION: CT chest: No acute pulmonary embolus. Mild peribronchial thickening likely representing bronchitic change. No pneumonia, CHF, effusion or pneumothorax. CT abdomen and pelvis: 1. Large volume of retained stool throughout the colon compatible with constipation. Small collection of non digested capsular fragments from medication noted in the cecum and ascending colon as well as a few others seen further distally within the colon. 2. Large stool ball within the rectosigmoid with mild transmural thickening  suspicious for stercoral proctocolitis. 3. Punctate nonobstructing calculus versus polyp within the gallbladder without secondary signs of acute cholecystitis. Electronically Signed   By: Tollie Eth M.D.   On: 07/24/2017 14:45    Procedures Procedures (including critical care time)  Medications Ordered in ED Medications  sodium phosphate (FLEET) 7-19 GM/118ML enema 1 enema (not administered)  ciprofloxacin (CIPRO) tablet 500 mg (not administered)  metroNIDAZOLE (FLAGYL) tablet 500 mg (not administered)  sodium chloride 0.9 % bolus 1,000 mL (1,000 mLs Intravenous New Bag/Given 07/24/17 1219)  acetaminophen (TYLENOL) tablet 650 mg (650 mg Oral Given 07/24/17 1155)  iopamidol (ISOVUE-370) 76 % injection (100 mLs  Contrast Given 07/24/17 1405)     Initial Impression / Assessment and Plan / ED Course  I have reviewed the triage vital signs and the nursing notes.  Pertinent labs & imaging results that were available during my care of the patient were reviewed by me and considered in my medical decision making (see chart for details).     Patient presents with cough, chest wall pain reproducible on palpation, and low-grade fever.  Also complains of abdominal distention.  Vital signs are otherwise stable, and his fever improved after administration of Tylenol.  Chest x-ray shows no acute cardiopulmonary abnormality.  Lactate is within normal limits, UA is not concerning for UTI, and remainder lab work is reassuring with no leukocytosis, or significant anemia or electrolyte abnormalities.  EKG shows nonspecific T wave abnormalities but no significant change from last tracing.  CTA of the chest shows no evidence of PE, pneumonia, or pleural effusion.  There is evidence of bronchitis.  CT of the abdomen shows large volume of retained stool which is unsurprising given the patient's history of chronic constipation.  There is a large stool ball within the rectosigmoid region with findings suspicious for  proctocolitis.  No abscess, perforation, or obstruction noted.  No acute surgical abdominal pathology identified.  Patient is resting comfortably in no apparent distress on reevaluation.  Repeat abdominal examination  shows tenderness has resolved.  Will give first dose of Cipro and Flagyl for colitis in the ED today.  4:34 PM  Awaiting second troponin.  If serial troponins are negative, patient is stable for discharge home with p.o. Cipro and Flagyl for colitis as well as Tessalon for his bronchitis.  Signed out to oncoming provider PA Margarette Asal.  Patient is aware to follow-up with his primary care physician for reevaluation.  I also discussed indications for return to the ED with the patient and his sister.  They both verbalized understanding of and agreement with this plan and pending second troponin patient is stable for discharge home.  Final Clinical Impressions(s) / ED Diagnoses   Final diagnoses:  Proctocolitis  Bronchitis  Atypical chest pain    ED Discharge Orders        Ordered    metroNIDAZOLE (FLAGYL) 500 MG tablet  2 times daily     07/24/17 1614  ciprofloxacin (CIPRO) 500 MG tablet  Every 12 hours     07/24/17 1614    benzonatate (TESSALON) 100 MG capsule  Every 8 hours     07/24/17 1614       Baylen Dea, [place] A, PA-C 07/24/17 1636    Charlynne Pander, MD 07/25/17 [phone]

## 2017-07-24 NOTE — Discharge Instructions (Signed)
Please take all of your antibiotics until finished!   You may develop abdominal discomfort or diarrhea from the antibiotic.  You may help offset this with probiotics which you can buy or get in yogurt. Do not eat  or take the probiotics until 2 hours after your antibiotic. Alternate 600 mg of ibuprofen and [phone] mg of Tylenol every 3 hours as needed for pain/fever. Do not exceed 4000 mg of Tylenol daily.  You may take Tessalon as needed for cough.  Drink plenty of fluids and get plenty of rest.  Follow-up with your primary care physician for reevaluation of your symptoms.  Return to the emergency department if any concerning signs or symptoms develop such as fever not controlled by ibuprofen or Tylenol, worsening pain in the chest or difficulty breathing, or blood in the urine or stool.

## 2017-07-24 NOTE — ED Provider Notes (Signed)
Patient handed off to me by previous ED PA at shift change pending a delta troponin. Plan is to discharge if serial troponin negative. He is to be discharged with Cipro and Flagyl for colitis as well as Tessalon for bronchitis.  1735: On my exam vital signs are within normal limits and at baseline while in the emergency department. Patient endorses lower abdominal pain with deep palpation only. Regular rate and rhythm. Lungs clear to auscultation bilaterally. Normal bowel sounds, no tenderness to abdomen with mild pressure. He tolerated fluid challenge. Discussed plan to discharge. Patient was agreeable, all questions and concerns were addressed.   Liberty HandyGibbons, Tris Howell J, PA-C 07/24/17 1738    Arby BarrettePfeiffer, Marcy, MD 07/29/17 [phone]

## 2017-07-24 NOTE — ED Notes (Signed)
Pt able to tolerate po meds and fluids

## 2017-07-24 NOTE — ED Notes (Signed)
Prior to enema administration, patient had small solid stool. Enema administration was met with small amount resistant then minimal resistance. Patient placed on bedpan, call light given.

## 2017-07-25 LAB — URINE CULTURE

## 2017-07-29 LAB — CULTURE, BLOOD (ROUTINE X 2)
CULTURE: NO GROWTH
CULTURE: NO GROWTH
SPECIAL REQUESTS: ADEQUATE
Special Requests: ADEQUATE

## 2018-11-10 ENCOUNTER — Encounter (HOSPITAL_COMMUNITY): Payer: Self-pay | Admitting: Emergency Medicine

## 2018-11-10 ENCOUNTER — Emergency Department (HOSPITAL_COMMUNITY)
Admission: EM | Admit: 2018-11-10 | Discharge: 2018-11-10 | Disposition: A | Discharge status: Home / Self Care | Payer: Medicare Other | Attending: Emergency Medicine | Admitting: Emergency Medicine

## 2018-11-10 ENCOUNTER — Emergency Department (HOSPITAL_COMMUNITY): Payer: Medicare Other

## 2018-11-10 DIAGNOSIS — Z89611 Acquired absence of right leg above knee: Secondary | ICD-10-CM | POA: Diagnosis not present

## 2018-11-10 DIAGNOSIS — Z79899 Other long term (current) drug therapy: Secondary | ICD-10-CM | POA: Insufficient documentation

## 2018-11-10 DIAGNOSIS — Z89612 Acquired absence of left leg above knee: Secondary | ICD-10-CM | POA: Insufficient documentation

## 2018-11-10 DIAGNOSIS — G809 Cerebral palsy, unspecified: Secondary | ICD-10-CM | POA: Diagnosis not present

## 2018-11-10 DIAGNOSIS — R05 Cough: Secondary | ICD-10-CM | POA: Diagnosis not present

## 2018-11-10 DIAGNOSIS — R0789 Other chest pain: Secondary | ICD-10-CM | POA: Diagnosis present

## 2018-11-10 LAB — CBC WITH DIFFERENTIAL/PLATELET
Abs Immature Granulocytes: 0.04 10*3/uL (ref 0.00–0.07)
Basophils Absolute: 0 10*3/uL (ref 0.0–0.1)
Basophils Relative: 1 %
Eosinophils Absolute: 0.1 10*3/uL (ref 0.0–0.5)
Eosinophils Relative: 1 %
HCT: 45.5 % (ref 39.0–52.0)
Hemoglobin: 15.3 g/dL (ref 13.0–17.0)
Immature Granulocytes: 1 %
Lymphocytes Relative: 21 %
Lymphs Abs: 1.3 10*3/uL (ref 0.7–4.0)
MCH: 34.4 pg — ABNORMAL HIGH (ref 26.0–34.0)
MCHC: 33.6 g/dL (ref 30.0–36.0)
MCV: 102.2 fL — ABNORMAL HIGH (ref 80.0–100.0)
Monocytes Absolute: 0.6 10*3/uL (ref 0.1–1.0)
Monocytes Relative: 9 %
Neutro Abs: 4.4 10*3/uL (ref 1.7–7.7)
Neutrophils Relative %: 67 %
Platelets: 203 10*3/uL (ref 150–400)
RBC: 4.45 MIL/uL (ref 4.22–5.81)
RDW: 12.4 % (ref 11.5–15.5)
WBC: 6.4 10*3/uL (ref 4.0–10.5)
nRBC: 0 % (ref 0.0–0.2)

## 2018-11-10 LAB — BASIC METABOLIC PANEL
Anion gap: 10 (ref 5–15)
BUN: 10 mg/dL (ref 6–20)
CO2: 23 mmol/L (ref 22–32)
Calcium: 9.4 mg/dL (ref 8.9–10.3)
Chloride: 108 mmol/L (ref 98–111)
Creatinine, Ser: 0.44 mg/dL — ABNORMAL LOW (ref 0.61–1.24)
GFR calc Af Amer: >60 mL/min (ref >60)
GFR calc non Af Amer: >60 mL/min (ref >60)
Glucose, Bld: 109 mg/dL — ABNORMAL HIGH (ref 70–99)
Potassium: 3.8 mmol/L (ref 3.5–5.1)
Sodium: 141 mmol/L (ref 135–145)

## 2018-11-10 LAB — TROPONIN I
Troponin I: <0.03 ng/mL (ref <0.03)
Troponin I: <0.03 ng/mL (ref <0.03)

## 2018-11-10 MED ORDER — SODIUM CHLORIDE 0.9 % IV BOLUS
500.0000 mL | Freq: Once | INTRAVENOUS | Status: AC
  Administered 2018-11-10: 18:00:00 500 mL via INTRAVENOUS

## 2018-11-10 MED ORDER — SODIUM CHLORIDE 0.9 % IV BOLUS
500.0000 mL | Freq: Once | INTRAVENOUS | Status: AC
  Administered 2018-11-10: 13:00:00 500 mL via INTRAVENOUS

## 2018-11-10 NOTE — ED Notes (Signed)
This RN acting as Statistician and asked pt if he would like for me to update any family. Pt requested that I call his sister, Earl Gala. I was able to inform his sister on the plan of care. Pt's sister will pick him up for discharge. Please call her at [phone].

## 2018-11-10 NOTE — ED Triage Notes (Signed)
Pt lives at home and sister is caretaker- was at day care and was eating snack. He felt central chest pain. EMS gave aspirin and nitro x1. Pain initially a 6 and now a 4. 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 He states he feels like he has a sore throat. Denies any other symptioms

## 2018-11-10 NOTE — ED Notes (Signed)
Drink given 

## 2018-11-10 NOTE — ED Provider Notes (Signed)
[facility] EMERGENCY DEPARTMENT Provider Note   CSN: [id number] Arrival date & time: 11/10/18  1213    History   Chief Complaint Chief Complaint  Patient presents with  . Chest Pain    HPI [name] is a 53 y.o. male.     Patient is a 53 year old male with a history of cerebral palsy, schizoaffective disorder, hyperlipidemia and bilateral AKA's who presents with chest pain.  He was at his daycare today and was eating a cracker.  He states after he swallowed the cracker he developed some pain to the center of his chest.  It was nonradiating.  He had a little bit of coughing but no shortness of breath.  No nausea or vomiting.  He states that he did not choke on the cracker.  No diaphoresis.  No history of similar symptoms.  He was given aspirin and nitroglycerin by EMS and states his pain has subsided.  It is 2 out of 10 currently.  He denies any recent illnesses.  No fevers.  No vomiting.  He describes the pain as a dull, achy type pain.     Past Medical History:  Diagnosis Date  . 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 Cerebral palsy (HCC)   . Constipation   . Epidermoid cyst of skin of chest 11/13/2015  . Psychotic disorder (HCC)   . Schizoaffective disorder (HCC)    "mild"    Patient Active Problem List   Diagnosis Date Noted  . Epidermoid cyst of skin of chest 11/13/2015    Past Surgical History:  Procedure Laterality Date  . LEG AMPUTATION ABOVE KNEE Bilateral   . MASS EXCISION N/A 11/13/2015   Procedure: EXCISION BACK MASS 1.5 CM    (MINOR PROCEDURE) ;  Surgeon: Claud Kelp, MD;  Location: [facility] OR;  Service: General;  Laterality: N/A;        Home Medications    Prior to Admission medications   Medication Sig Start Date End Date Taking? Authorizing Provider  benzonatate (TESSALON) 100 MG capsule Take 1 capsule (100 mg total) by mouth every 8 (eight) hours. 07/24/17   Fawze, Mina A, PA-C  dextromethorphan  (DELSYM) 30 MG/5ML liquid Take 60 mg by mouth 2 (two) times daily.    [provider]  FLUoxetine (PROZAC) 20 MG capsule Take 60 mg by mouth daily after breakfast.     [provider]  gabapentin (NEURONTIN) 100 MG capsule Take 200 mg by mouth at bedtime.    [provider]  gabapentin (NEURONTIN) 400 MG capsule Take 400 mg by mouth at bedtime.    [provider]  lubiprostone (AMITIZA) 24 MCG capsule Take 24 mcg by mouth 2 (two) times daily.    [provider]  meclizine (ANTIVERT) 25 MG tablet Take 25 mg by mouth at bedtime.    [provider]  metoCLOPramide (REGLAN) 5 MG tablet Take 5 mg by mouth 3 (three) times daily.     [provider]  OLANZapine (ZYPREXA) 20 MG tablet Take 20 mg by mouth at bedtime.     [provider]  Omega-3 Fatty Acids (FISH OIL) 1000 MG CAPS Take 1,000 mg by mouth 2 (two) times daily.    [provider]  omeprazole (PRILOSEC) 20 MG capsule Take 20 mg by mouth 2 (two) times daily.    [provider]  pravastatin (PRAVACHOL) 40 MG tablet Take 40 mg by mouth at bedtime.    [provider]  vitamin C (ASCORBIC ACID) 500 MG tablet Take 500 mg by mouth daily.    [provider]  vitamin E 400 UNIT capsule Take 400 Units by mouth daily.    [provider]    Family History Family History  Problem Relation Age of Onset  . Hypertension Father     Social History Social History   Tobacco Use  . Smoking status: Never Smoker  Substance Use Topics  . Alcohol use: No  . Drug use: Not on file     Allergies   Patient has no known allergies.   Review of Systems Review of Systems  Constitutional: Negative for chills, diaphoresis, fatigue and fever.  HENT: Negative for congestion, rhinorrhea and sneezing.   Eyes: Negative.   Respiratory: Negative for cough, chest tightness and shortness of breath.   Cardiovascular: Positive for chest pain. Negative  for leg swelling.  Gastrointestinal: Negative for abdominal pain, blood in stool, diarrhea, nausea and vomiting.  Genitourinary: Negative for difficulty urinating, flank pain, frequency and hematuria.  Musculoskeletal: Negative for arthralgias and back pain.  Skin: Negative for rash.  Neurological: Negative for dizziness, speech difficulty, weakness, numbness and headaches.     Physical Exam Updated Vital Signs BP (!) 108/56   Pulse 81   Temp 98.2 F (36.8 C) (Oral)   Resp 14   SpO2 99%   Physical Exam Constitutional:      Appearance: He is well-developed.  HENT:     Head: Normocephalic and atraumatic.  Eyes:     Pupils: Pupils are equal, round, and reactive to light.  Neck:     Musculoskeletal: Normal range of motion and neck supple.  Cardiovascular:     Rate and Rhythm: Normal rate and regular rhythm.     Heart sounds: Normal heart sounds.  Pulmonary:     Effort: Pulmonary effort is normal. No respiratory distress.     Breath sounds: Normal breath sounds. No wheezing or rales.  Chest:     Chest wall: No tenderness.  Abdominal:     General: Bowel sounds are normal.     Palpations: Abdomen is soft.     Tenderness: There is no abdominal tenderness. There is no guarding or rebound.     Comments: Bilateral AKA, contractures to the upper extremities  Musculoskeletal: Normal range of motion.  Lymphadenopathy:     Cervical: No cervical adenopathy.  Skin:    General: Skin is warm and dry.     Findings: No rash.  Neurological:     Mental Status: He is alert and oriented to person, place, and time.      ED Treatments / Results  Labs (all labs ordered are listed, but only abnormal results are displayed) Labs Reviewed  BASIC METABOLIC PANEL - Abnormal; Notable for the following components:      Result Value   Glucose, Bld 109 (*)    Creatinine, Ser 0.44 (*)    All other components within normal limits  CBC WITH DIFFERENTIAL/PLATELET - Abnormal; Notable for the  following components:   MCV 102.2 (*)    MCH 34.4 (*)    All other components within normal limits  TROPONIN I  TROPONIN I    EKG EKG Interpretation  Date/Time:  Tuesday Nov 10 2018 12:18:31 EDT Ventricular Rate:  74 PR Interval:    QRS Duration: 74 QT Interval:  357 QTC Calculation: 396 R Axis:   38 Text Interpretation:  Sinus rhythm Abnormal R-wave progression, early transition Borderline repolarization  abnormality since last tracing no significant change Confirmed by Rolan Bucco [phone]) on 11/10/2018 12:35:11 PM   Radiology Dg Chest 2 View  Result Date: 11/10/2018 CLINICAL DATA:  Central chest pain for 1 hour. EXAM: CHEST - 2 VIEW COMPARISON:  None. FINDINGS: The heart size and mediastinal contours are within normal limits. Both lungs are clear. The visualized skeletal structures are unremarkable. IMPRESSION: No active cardiopulmonary disease. Electronically Signed   By: Elsie Stain M.D.   On: 11/10/2018 13:23    Procedures Procedures (including critical care time)  Medications Ordered in ED Medications  sodium chloride 0.9 % bolus 500 mL (0 mLs Intravenous Stopped 11/10/18 1406)  sodium chloride 0.9 % bolus 500 mL (500 mLs Intravenous New Bag/Given 11/10/18 1812)     Initial Impression / Assessment and Plan / ED Course  I have reviewed the triage vital signs and the nursing notes.  Pertinent labs & imaging results that were available during my care of the patient were reviewed by me and considered in my medical decision making (see chart for details).        Patient is a 53 year old male who presents with chest pain.  It started after eating a cracker.  His EKG shows no ischemic changes.  His chest pain is somewhat reproducible on palpation.  He has had 2- troponins.  His chest x-ray is clear without evidence of pneumonia or pneumothorax.  He is currently pain-free.  His blood pressure is been a little soft which may be attributed to the nitroglycerin that was  given by EMS.  He was given some IV fluids and his blood pressure improved.  He is asymptomatic from this.  Given his lack of current symptoms, I doubt other etiologies such as aortic injury, PE or infection.  He was discharged home in good condition.  He will follow-up with his PCP.  Return precautions were given.  Final Clinical Impressions(s) / ED Diagnoses   Final diagnoses:  Atypical chest pain    ED Discharge Orders    None       Rolan Bucco, MD 11/10/18 1826

## 2023-05-05 ENCOUNTER — Emergency Department (HOSPITAL_COMMUNITY): Payer: 59

## 2023-05-05 ENCOUNTER — Encounter (HOSPITAL_COMMUNITY): Payer: Self-pay | Admitting: Emergency Medicine

## 2023-05-05 ENCOUNTER — Other Ambulatory Visit: Payer: Self-pay

## 2023-05-05 ENCOUNTER — Emergency Department (HOSPITAL_COMMUNITY)
Admission: EM | Admit: 2023-05-05 | Discharge: 2023-05-05 | Disposition: A | Discharge status: Home / Self Care | Payer: 59 | Attending: Emergency Medicine | Admitting: Emergency Medicine

## 2023-05-05 DIAGNOSIS — R079 Chest pain, unspecified: Secondary | ICD-10-CM | POA: Diagnosis present

## 2023-05-05 DIAGNOSIS — K209 Esophagitis, unspecified without bleeding: Secondary | ICD-10-CM

## 2023-05-05 LAB — BASIC METABOLIC PANEL
Anion gap: 15 (ref 5–15)
BUN: 12 mg/dL (ref 6–20)
CO2: 20 mmol/L — ABNORMAL LOW (ref 22–32)
Calcium: 9.5 mg/dL (ref 8.9–10.3)
Chloride: 106 mmol/L (ref 98–111)
Creatinine, Ser: 0.48 mg/dL — ABNORMAL LOW (ref 0.61–1.24)
GFR, Estimated: >60 mL/min (ref >60)
Glucose, Bld: 104 mg/dL — ABNORMAL HIGH (ref 70–99)
Potassium: 4.1 mmol/L (ref 3.5–5.1)
Sodium: 141 mmol/L (ref 135–145)

## 2023-05-05 LAB — D-DIMER, QUANTITATIVE: D-Dimer, Quant: 0.57 ug{FEU}/mL — ABNORMAL HIGH (ref 0.00–0.50)

## 2023-05-05 LAB — TROPONIN I (HIGH SENSITIVITY)
Troponin I (High Sensitivity): 5 ng/L (ref <18)
Troponin I (High Sensitivity): 4 ng/L (ref <18)

## 2023-05-05 LAB — CBC
HCT: 46 % (ref 39.0–52.0)
Hemoglobin: 15.1 g/dL (ref 13.0–17.0)
MCH: 34.4 pg — ABNORMAL HIGH (ref 26.0–34.0)
MCHC: 32.8 g/dL (ref 30.0–36.0)
MCV: 104.8 fL — ABNORMAL HIGH (ref 80.0–100.0)
Platelets: 202 10*3/uL (ref 150–400)
RBC: 4.39 MIL/uL (ref 4.22–5.81)
RDW: 12.5 % (ref 11.5–15.5)
WBC: 7.5 10*3/uL (ref 4.0–10.5)
nRBC: 0 % (ref 0.0–0.2)

## 2023-05-05 MED ORDER — ASPIRIN 81 MG PO CHEW
81.0000 mg | CHEWABLE_TABLET | Freq: Once | ORAL | Status: DC
  Filled 2023-05-05: qty 1

## 2023-05-05 MED ORDER — IOHEXOL 350 MG/ML SOLN
75.0000 mL | Freq: Once | INTRAVENOUS | Status: AC | PRN
  Administered 2023-05-05: 75 mL via INTRAVENOUS

## 2023-05-05 MED ORDER — KETOROLAC TROMETHAMINE 15 MG/ML IJ SOLN
15.0000 mg | Freq: Once | INTRAMUSCULAR | Status: AC
  Administered 2023-05-05: 15 mg via INTRAVENOUS
  Filled 2023-05-05: qty 1

## 2023-05-05 MED ORDER — ALUM & MAG HYDROXIDE-SIMETH 200-200-20 MG/5ML PO SUSP
30.0000 mL | Freq: Once | ORAL | Status: AC
  Administered 2023-05-05: 30 mL via ORAL
  Filled 2023-05-05: qty 30

## 2023-05-05 NOTE — ED Triage Notes (Signed)
Pt from home via [facility] with reports of CP and SHOB. Pt was given 324 ASA en route. Pt rates pain 8/10 at this time.

## 2023-05-05 NOTE — ED Provider Triage Note (Signed)
Emergency Medicine Provider Triage Evaluation Note  [name] , a 57 y.o. male  was evaluated in triage.  Pt complains of chest pain, pleuritic.  Review of Systems  Positive: Chest pain Negative: vomiting  Physical Exam  BP 106/85   Pulse 76   Temp (!) 97.3 F (36.3 C) (Oral)   Resp 16   SpO2 98%  Gen:   Awake, no distress   Resp:  Normal effort  MSK:   Moves extremities without difficulty  Other:  Amputation hx  Medical Decision Making  Medically screening exam initiated at 1:02 PM.  Appropriate orders placed.  [name] was informed that the remainder of the evaluation will be completed by another provider, this initial triage assessment does not replace that evaluation, and the importance of remaining in the ED until their evaluation is complete.  Ordered cardiac labs. Dimer.   Christian Kaplan, MD 05/05/23 [id number]

## 2023-05-05 NOTE — ED Notes (Signed)
IV team at bedside 

## 2023-05-05 NOTE — ED Provider Notes (Addendum)
  Physical Exam  BP (!) 95/54   Pulse 70   Temp (!) 97.3 F (36.3 C) (Oral)   Resp 15   SpO2 100%   Physical Exam  Procedures  Procedures  ED Course / MDM    Medical Decision Making Amount and/or Complexity of Data Reviewed Labs: ordered. Radiology: ordered.  Risk OTC drugs. Prescription drug management.    Chest pain.  Troponins reassuring.  Pending CTA.      Benjiman Core, MD 05/05/23 1649  CTA showed potential bronchitis and possible esophagitis or gastritis.  Has been treated for these previously.  Discussed with patient's sister.  Will discharge home.    Benjiman Core, MD 05/05/23 2023

## 2023-05-12 NOTE — ED Provider Notes (Signed)
[place] EMERGENCY DEPARTMENT AT [facility] Provider Note   CSN: [id number] Arrival date & time: 05/05/23  1200     History  Chief Complaint  Patient presents with   Chest Pain    [name] is a 57 y.o. male.   Chest Pain Patient with chest pain.  Pleuritic history of cerebral palsy.  No known cardiac history.  May be worse after eating.  Somewhat immobile at baseline.    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 Authorizing Provider  benzonatate (TESSALON) 100 MG capsule Take 1 capsule (100 mg total) by mouth every 8 (eight) hours. 07/24/17   Fawze, Mina A, PA-C  dextromethorphan (DELSYM) 30 MG/5ML liquid Take 60 mg by mouth 2 (two) times daily.    [provider]  FLUoxetine (PROZAC) 20 MG capsule Take 60 mg by mouth daily after breakfast.     [provider]  gabapentin (NEURONTIN) 100 MG capsule Take 200 mg by mouth at bedtime.    [provider]  gabapentin (NEURONTIN) 400 MG capsule Take 400 mg by mouth at bedtime.    [provider]  lubiprostone (AMITIZA) 24 MCG capsule Take 24 mcg by mouth 2 (two) times daily.    [provider]  meclizine (ANTIVERT) 25 MG tablet Take 25 mg by mouth at bedtime.    [provider]  metoCLOPramide (REGLAN) 5 MG tablet Take 5 mg by mouth 3 (three) times daily.     [provider]  OLANZapine (ZYPREXA) 20 MG tablet Take 20 mg by mouth at bedtime.     [provider]  Omega-3 Fatty Acids (FISH OIL) 1000 MG CAPS Take 1,000 mg by mouth 2 (two) times daily.    [provider]  omeprazole (PRILOSEC) 20 MG capsule Take 20 mg by  mouth 2 (two) times daily.    [provider]  pravastatin (PRAVACHOL) 40 MG tablet Take 40 mg by mouth at bedtime.    [provider]  vitamin C (ASCORBIC ACID) 500 MG tablet Take 500 mg by mouth daily.    [provider]  vitamin E 400 UNIT capsule Take 400 Units by mouth daily.    [provider]      Allergies    Patient has no known allergies.    Review of Systems   Review of Systems  Cardiovascular:  Positive for chest pain.    Physical Exam Updated Vital Signs BP 110/78   Pulse 64   Temp 98 F (36.7 C) (Christian)   Resp 10   SpO2 96%  Physical Exam Vitals and nursing note reviewed.  Cardiovascular:     Rate and Rhythm: Normal rate.  Chest:     Chest wall: No tenderness.  Abdominal:     Tenderness: There is abdominal tenderness.     Comments: Some epigastric tenderness.  Neurological:     Comments: At his baseline.     ED Results / Procedures / Treatments   Labs (all labs ordered are listed, but only abnormal results are displayed) Labs Reviewed  BASIC METABOLIC PANEL - Abnormal; Notable for the following components:  Result Value   CO2 20 (*)    Glucose, Bld 104 (*)    Creatinine, Ser 0.48 (*)    All other components within normal limits  CBC - Abnormal; Notable for the following components:   MCV 104.8 (*)    MCH 34.4 (*)    All other components within normal limits  D-DIMER, QUANTITATIVE - Abnormal; Notable for the following components:   D-Dimer, Quant 0.57 (*)    All other components within normal limits  TROPONIN I (HIGH SENSITIVITY)  TROPONIN I (HIGH SENSITIVITY)    EKG EKG Interpretation Date/Time:  Monday May 05 2023 12:08:05 EST Ventricular Rate:  79 PR Interval:  146 QRS Duration:  72 QT Interval:  368 QTC Calculation: 421 R Axis:   14  Text Interpretation: Normal sinus rhythm Nonspecific T wave abnormality Abnormal ECG When compared with ECG of 10-Nov-2018 12:18, PREVIOUS ECG IS PRESENT No  acute changes No significant change since last tracing Confirmed by Derwood Kaplan [phone]) on 05/05/2023 12:49:49 PM  Radiology No results found.  Procedures Procedures    Medications Ordered in ED Medications  alum & mag hydroxide-simeth (MAALOX/MYLANTA) 200-200-20 MG/5ML suspension 30 mL (30 mLs Christian Given 05/05/23 1559)  ketorolac (TORADOL) 15 MG/ML injection 15 mg (15 mg Intravenous Given 05/05/23 1559)  iohexol (OMNIPAQUE) 350 MG/ML injection 75 mL (75 mLs Intravenous Contrast Given 05/05/23 1759)    ED Course/ Medical Decision Making/ A&P                                 Medical Decision Making Amount and/or Complexity of Data Reviewed Labs: ordered. Radiology: ordered.  Risk OTC drugs. Prescription drug management.   Patient with chest pain.  Pleuritic.  Some shortness of breath.  D-dimer positive.  CT scan done and showed potential esophagitis and gastritis.  Also potential bronchitis.  EKG reassuring.  Troponin negative.  Appears stable for discharge home.  Discussed with patient's family ember.        Final Clinical Impression(s) / ED Diagnoses Final diagnoses:  Esophagitis  Nonspecific chest pain    Rx / DC Orders ED Discharge Orders     None         Benjiman Core, MD 05/12/23 1558

## 2023-06-30 NOTE — Progress Notes (Signed)
 Sent note to [facility]

## 2023-08-26 NOTE — Telephone Encounter (Signed)
 From: Gayani Yahampath Dasanayaka, MD  Original Sender: [email or web address] (Surescripts HISP)  Addressed To: Gayani Yahampath Dasanayaka, MD  Routed To: Hoy LITTIE Poncho, RN; Curtis Jenkins Files, RN; Gustav Gelineau Monroe, CMA; [facility]  Subject: FW: [CLINICAL PROGRAM NOTIFICATION] [[place]] [[place]ris]    FW: [CLINICAL PROGRAM NOTIFICATION] [[place]] [[place]] Received: 2 days ago Gayani Yahampath Dasanayaka, MD  P [facility]; Hoy LITTIE Poncho, RN; Gustav Gelineau Safriet, CMA; Curtis Jenkins Files, RN  Please do not keep refilling this patient's metoclopramide.  His psychiatrist should be prescribing this, as per sister, psychiatrist is the physician wanting to continue this medication.  I had recommended tapering off as it has serious adverse effects. Please make note in the chart not to refill this any longer from our office.  Sister will need to reach out to the psychiatrist for refills.       Previous Messages    ----- Message ----- From: [email or web address] (Surescripts HISP) Sent: 08/21/2023  12:17 AM EST To: Gayani Yahampath Dasanayaka, MD ([facility]) Subject: ROLLO PROGRAM NOTIFICATION] [Dahir] [[place]]  Attached is a clinical program notification pertaining to your patient.<br/>Note this mailbox is used for outbound messages only, please do not reply.<br/><br/>[facility] Clinical Department, [address] [place], [place] [address]<br/><br/><br/><br/><br/> [facility] clinical notifications are sent to you for the safety of your patients. If you want to opt out of these notifications, email [email or web address].<br/>Please note: By opting out, you risk missing important medication alerts that could cause disruption to your patients' therapy.

## 2024-01-22 NOTE — Progress Notes (Signed)
 Ear Cerumen Removal  Date/Time: 01/22/2024 10:20 AM  Performed by: Clotilda Lajuana Revering, CMA Authorized by: Jon Niels Berth, NP   Procedure Details:  Impacted cerumen: Yes Location details: bilateral Procedure type: irrigation  Post Procedure Details:  Procedure findings: complete impaction removal and no bleeding Patient tolerance of procedure: patient tolerated the procedure without difficulty

## 2024-01-22 NOTE — Progress Notes (Signed)
 Subjective Patient ID: [name] is a 58 y.o. male.  Chief Complaint  Patient presents with  . Establish Care    The following information was reviewed by members of the visit team:  Tobacco  Allergies  Meds  Problems  Med Hx  Surg Hx  Fam Hx      [name] in today accompanied by his sister who is also his caregiver for chronic medical management.  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 He does have motorized wheelchair and operates this on his own.  GERD has been controlled well with current medication but they would like to restart the Reglan for maximum control. There is a warning with his SSRI but his sister states he has been on both concurrently for many years and did well.  Constipation is well controlled with Linzess. No frequent diarrhea, black or bloody stools or abdominal pain.  Psoriasis of scalp well controlled with current prescribed shampoo.  Hyperlipidemia controlled with current medications and tolerated well.  Depression is well controlled with current medication. No unusual sadness, hopelessness, anhedonia, SI or self harm thoughts, and no sleep disturbance.  Vitamin d deficiency and he is taking daily replacement.      Current Medications[1]   Review of Systems  Constitutional: Negative.   Respiratory: Negative.    Cardiovascular: Negative.   Gastrointestinal: Negative.   Genitourinary: Negative.   Musculoskeletal: Negative.   Skin: Negative.   Neurological: Negative.   Psychiatric/Behavioral: Negative.      Objective Physical Exam Vitals and nursing note reviewed.  Constitutional:      General: He is not in acute distress.    Appearance: Normal appearance.     Comments: Blood pressure 128/80, pulse 79, resp. rate 14, SpO2 99%.   HENT:     Head: Normocephalic and atraumatic.     Comments: Does have flaky scalp consistent with psoriasis.    Right Ear: Tympanic membrane, ear canal and  external ear normal.     Left Ear: Tympanic membrane, ear canal and external ear normal.  Neck:     Thyroid: No thyromegaly.     Vascular: No carotid bruit.   Cardiovascular:     Rate and Rhythm: Normal rate and regular rhythm.     Pulses:          Carotid pulses are 2+ on the right side and 2+ on the left side.      Radial pulses are 2+ on the right side and 2+ on the left side.     Heart sounds: Normal heart sounds, S1 normal and S2 normal.     Comments: Bilateral AKA; patient wheelchair bound but it is motorized and he is able to control it.  Musculoskeletal:     Comments: Bilateral AKA; patient wheelchair bound but it is motorized and he is able to control it.    Skin:    General: Skin is warm and dry.     Capillary Refill: Capillary refill takes less than 2 seconds.   Neurological:     Mental Status: He is alert. Mental status is at baseline.   Psychiatric:        Attention and Perception: Attention and perception normal.        Mood and Affect: Mood normal.        Speech: Speech normal.        Behavior: Behavior normal. Behavior is cooperative.     Assessment/Plan Diagnoses and all orders for this visit:  Bilateral impacted cerumen -     Ear Cerumen Removal  Chronic constipation -     linaCLOtide (Linzess) 290 mcg cap capsule; Take 1 capsule (290 mcg total) by mouth daily.  GERD without esophagitis -     omeprazole (PriLOSEC) 20 mg DR capsule; Take 1 capsule (20 mg total) by mouth daily.  Hyperlipidemia LDL goal <130 -     pravastatin (PRAVACHOL) 40 mg tablet; Take 1 tablet (40 mg total) by mouth nightly.  Major depressive disorder, single episode, in full remission  Psychophysiological insomnia -     gabapentin (NEURONTIN) 600 mg tablet; Take 1 tablet (600 mg total) by mouth daily.  Vitamin D deficiency  Spastic quadriplegic cerebral palsy (HCC)  Psoriasis of scalp  Other orders -     traZODone (DESYREL) 50 mg tablet; Take 1 tablet (50 mg total) by  mouth at bedtime. Take 1-2 tablets at bedtime prn -     metoclopramide (REGLAN) 5 mg/5 mL solution; Take 5 mL (5 mg total) by mouth 3 (three) times a day Indications: Only to be ordered by patient's psychiatrist. -     meclizine (ANTIVERT) 25 mg tablet; Take 1 tablet (25 mg total) by mouth nightly. -     gabapentin (NEURONTIN) 100 mg capsule; Take 1 capsule (100 mg total) by mouth at bedtime. With a 600 mg capsule -     DHS Tar Gel 0.5 % sham shampoo; Apply 1 Application topically every other day.     Electronically signed: Jon Niels Berth, NP 01/22/2024  11:11 AM        [1]  Current Outpatient Medications:  .  cholecalciferol (VITAMIN D3) 1,000 unit (25 mcg) tablet, TAKE 1 TABLET BY MOUTH ONCE DAILY, Disp: 90 each, Rfl: 1 .  FLUoxetine (PROzac) 20 mg capsule, TAKE 1 CAPSULE BY MOUTH ONCE DAILY FOR anxiety/depression, Disp: 90 capsule, Rfl: 1 .  LORazepam (ATIVAN) 0.5 mg tablet, daily as needed., Disp: , Rfl:  .  melatonin 3 mg tablet, TAKE 1 TABLET BY MOUTH AT BEDTIME, Disp: 90 tablet, Rfl: 1 .  MISCELLANEOUS MEDICATION/PRODUCT, by scalp route every other day. Tar base shampoo, Disp: 1 each, Rfl: 5 .  nystatin (MYCOSTATIN) 100,000 unit/gram cream, APPLY TO AFFECTED AREA ON THE GROIN TWICE DAILY AS NEEDED, Disp: 15 g, Rfl: 0 .  OLANZapine (ZyPREXA ZYDIS) 5 mg disintegrating tablet, Dissolve 5 mg on tongue every morning. For aggression/mood stabilization, Disp: , Rfl:  .  OLANZapine (ZyPREXA) 20 mg tablet, Take 20 mg by mouth nightly. Indications: depression associated with bipolar disorder, adjunct treatment, Disp: 30 tablet, Rfl: 5 .  omega 3-dha-epa-fish oil (FISH OIL) 1,000 mg cap capsule, Take 1 capsule (1 g total) by mouth 2 (two) times a day., Disp: 180 capsule, Rfl: 2 .  omega 3-dha-epa-fish oil (OMEGA 3) 1,000 mg capsule, TAKE 1 CAPSULE BY MOUTH 2 TIMES DAILY, Disp: 60 capsule, Rfl: 1 .  Vitamin B-12 1,000 mcg tablet, TAKE 1 TABLET BY MOUTH ONCE DAILY, Disp: 90 tablet, Rfl:  1 .  DHS Tar Gel 0.5 % sham shampoo, Apply 1 Application topically every other day., Disp: 473 mL, Rfl: 3 .  gabapentin (NEURONTIN) 100 mg capsule, Take 1 capsule (100 mg total) by mouth at bedtime. With a 600 mg capsule, Disp: 90 capsule, Rfl: 3 .  gabapentin (NEURONTIN) 600 mg tablet, Take 1 tablet (600 mg total) by mouth daily., Disp: 90 tablet, Rfl: 3 .  linaCLOtide (Linzess) 290 mcg cap capsule, Take 1 capsule (290 mcg total) by  mouth daily., Disp: 90 capsule, Rfl: 3 .  meclizine (ANTIVERT) 25 mg tablet, Take 1 tablet (25 mg total) by mouth nightly., Disp: 90 tablet, Rfl: 3 .  metoclopramide (REGLAN) 5 mg/5 mL solution, Take 5 mL (5 mg total) by mouth 3 (three) times a day Indications: Only to be ordered by patient's psychiatrist., Disp: 473 mL, Rfl: 3 .  omeprazole (PriLOSEC) 20 mg DR capsule, Take 1 capsule (20 mg total) by mouth daily., Disp: 90 capsule, Rfl: 3 .  pravastatin (PRAVACHOL) 40 mg tablet, Take 1 tablet (40 mg total) by mouth nightly., Disp: 15 tablet, Rfl: 3 .  traZODone (DESYREL) 50 mg tablet, Take 1 tablet (50 mg total) by mouth at bedtime. Take 1-2 tablets at bedtime prn, Disp: 90 tablet, Rfl: 3

## 2024-01-28 NOTE — Progress Notes (Signed)
 Per Insurance patient Is due for colon screening. Tried to reach by phone and there was no answer,left message to call.   Monica Kipper CMA  Patient Care Advocate[facility] Phone# [phone]

## 2024-03-15 ENCOUNTER — Encounter (HOSPITAL_COMMUNITY): Payer: Self-pay

## 2024-03-15 ENCOUNTER — Other Ambulatory Visit: Payer: Self-pay

## 2024-03-15 ENCOUNTER — Emergency Department (HOSPITAL_COMMUNITY): Admission: EM | Admit: 2024-03-15 | Discharge: 2024-03-16 | Disposition: A | Discharge status: Home / Self Care

## 2024-03-15 ENCOUNTER — Emergency Department (HOSPITAL_COMMUNITY)

## 2024-03-15 DIAGNOSIS — M79605 Pain in left leg: Secondary | ICD-10-CM | POA: Diagnosis present

## 2024-03-15 DIAGNOSIS — M792 Neuralgia and neuritis, unspecified: Secondary | ICD-10-CM

## 2024-03-15 DIAGNOSIS — M79652 Pain in left thigh: Secondary | ICD-10-CM | POA: Diagnosis not present

## 2024-03-15 LAB — CBC WITH DIFFERENTIAL/PLATELET
Abs Immature Granulocytes: 0.02 K/uL (ref 0.00–0.07)
Basophils Absolute: 0.1 K/uL (ref 0.0–0.1)
Basophils Relative: 1 %
Eosinophils Absolute: 0.1 K/uL (ref 0.0–0.5)
Eosinophils Relative: 1 %
HCT: 42.1 % (ref 39.0–52.0)
Hemoglobin: 14 g/dL (ref 13.0–17.0)
Immature Granulocytes: 0 %
Lymphocytes Relative: 22 %
Lymphs Abs: 1.5 K/uL (ref 0.7–4.0)
MCH: 33.9 pg (ref 26.0–34.0)
MCHC: 33.3 g/dL (ref 30.0–36.0)
MCV: 101.9 fL — ABNORMAL HIGH (ref 80.0–100.0)
Monocytes Absolute: 0.6 K/uL (ref 0.1–1.0)
Monocytes Relative: 9 %
Neutro Abs: 4.6 K/uL (ref 1.7–7.7)
Neutrophils Relative %: 67 %
Platelets: 215 K/uL (ref 150–400)
RBC: 4.13 MIL/uL — ABNORMAL LOW (ref 4.22–5.81)
RDW: 12.1 % (ref 11.5–15.5)
WBC: 6.9 K/uL (ref 4.0–10.5)
nRBC: 0 % (ref 0.0–0.2)

## 2024-03-15 LAB — COMPREHENSIVE METABOLIC PANEL WITH GFR
ALT: 14 U/L (ref 0–44)
AST: 17 U/L (ref 15–41)
Albumin: 3.4 g/dL — ABNORMAL LOW (ref 3.5–5.0)
Alkaline Phosphatase: 72 U/L (ref 38–126)
Anion gap: 11 (ref 5–15)
BUN: 15 mg/dL (ref 6–20)
CO2: 24 mmol/L (ref 22–32)
Calcium: 9 mg/dL (ref 8.9–10.3)
Chloride: 105 mmol/L (ref 98–111)
Creatinine, Ser: 0.58 mg/dL — ABNORMAL LOW (ref 0.61–1.24)
GFR, Estimated: >60 mL/min (ref >60)
Glucose, Bld: 130 mg/dL — ABNORMAL HIGH (ref 70–99)
Potassium: 3.8 mmol/L (ref 3.5–5.1)
Sodium: 140 mmol/L (ref 135–145)
Total Bilirubin: <0.2 mg/dL (ref 0.0–1.2)
Total Protein: 6.4 g/dL — ABNORMAL LOW (ref 6.5–8.1)

## 2024-03-15 MED ORDER — OXYCODONE-ACETAMINOPHEN 5-325 MG PO TABS: 1.0000 | ORAL_TABLET | Freq: Four times a day (QID) | ORAL | 0 refills | Status: AC | PRN

## 2024-03-15 MED ORDER — OXYCODONE-ACETAMINOPHEN 5-325 MG PO TABS
1.0000 | ORAL_TABLET | Freq: Once | ORAL | Status: AC
  Administered 2024-03-15: 1 via ORAL
  Filled 2024-03-15: qty 1

## 2024-03-15 MED ORDER — METHOCARBAMOL 500 MG PO TABS: 500.0000 mg | ORAL_TABLET | Freq: Two times a day (BID) | ORAL | 0 refills | Status: AC

## 2024-03-15 NOTE — ED Notes (Signed)
 Sister/POA Barnie Fellows [phone] would like an update asap

## 2024-03-15 NOTE — ED Notes (Signed)
Ptar called, 3rd in line

## 2024-03-15 NOTE — ED Provider Notes (Signed)
 [facility] AT [facility] Provider Note   CSN: [id number] Arrival date & time: 03/15/24  1304     Patient presents with: Stump Pain   [name] is a 58 y.o. male.  Patient with past history significant for bilateral leg amputation and above-the-knee, cerebral palsy, schizoaffective disorder presents to the emergency department concerns of pain in the left leg.  Reports pain is currently 10 out of 10 and Tylenol  is on improving pain.  Describes pain as a pins and needle type sensation.  He denies any recent falls, impact to the area, or notable leg swelling.  No fever, chills or bodyaches.  Denies any lesions or rashes present.  No reported discharge or drainage from the left leg.  HPI     Prior to Admission medications   Medication Sig Start Date End Date Taking? Authorizing Provider  methocarbamol  (ROBAXIN ) 500 MG tablet Take 1 tablet (500 mg total) by mouth 2 (two) times daily. 03/15/24  Yes Wassim Kirksey A, PA-C  oxyCODONE -acetaminophen  (PERCOCET/ROXICET) 5-325 MG tablet Take 1 tablet by mouth every 6 (six) hours as needed for severe pain (pain score 7-10). 03/15/24  Yes TRUE Shackleford A, PA-C  benzonatate  (TESSALON ) 100 MG capsule Take 1 capsule (100 mg total) by mouth every 8 (eight) hours. 07/24/17   Fawze, Mina A, PA-C  dextromethorphan (DELSYM) 30 MG/5ML liquid Take 60 mg by mouth 2 (two) times daily.    [provider]  FLUoxetine (PROZAC) 20 MG capsule Take 60 mg by mouth daily after breakfast.     [provider]  gabapentin (NEURONTIN) 100 MG capsule Take 200 mg by mouth at bedtime.    [provider]  gabapentin (NEURONTIN) 400 MG capsule Take 400 mg by mouth at bedtime.    [provider]  lubiprostone (AMITIZA) 24 MCG capsule Take 24 mcg by mouth 2 (two) times daily.    [provider]  meclizine (ANTIVERT) 25 MG tablet Take 25 mg by mouth at bedtime.    [provider]  metoCLOPramide  (REGLAN) 5 MG tablet Take 5 mg by mouth 3 (three) times daily.     [provider]  OLANZapine (ZYPREXA) 20 MG tablet Take 20 mg by mouth at bedtime.     [provider]  Omega-3 Fatty Acids (FISH OIL) 1000 MG CAPS Take 1,000 mg by mouth 2 (two) times daily.    [provider]  omeprazole (PRILOSEC) 20 MG capsule Take 20 mg by mouth 2 (two) times daily.    [provider]  pravastatin (PRAVACHOL) 40 MG tablet Take 40 mg by mouth at bedtime.    [provider]  vitamin C (ASCORBIC ACID) 500 MG tablet Take 500 mg by mouth daily.    [provider]  vitamin E 400 UNIT capsule Take 400 Units by mouth daily.    [provider]    Allergies: Patient has no known allergies.    Review of Systems  Musculoskeletal:        Leg pain  All other systems reviewed and are negative.   Updated Vital Signs BP 117/61 (BP Location: Right Arm)   Pulse 77   Temp 98.7 F (37.1 C) (Oral)   Resp 14   SpO2 99%   Physical Exam Vitals and nursing note reviewed.  Constitutional:      General: He is not in acute distress.    Appearance: He is well-developed.  HENT:     Head: Normocephalic and  atraumatic.  Eyes:     Conjunctiva/sclera: Conjunctivae normal.  Cardiovascular:     Rate and Rhythm: Normal rate and regular rhythm.     Heart sounds: No murmur heard. Pulmonary:     Effort: Pulmonary effort is normal. No respiratory distress.     Breath sounds: Normal breath sounds.  Abdominal:     Palpations: Abdomen is soft.     Tenderness: There is no abdominal tenderness.  Musculoskeletal:        General: Tenderness present. No swelling or signs of injury.     Cervical back: Neck supple.     Comments: Bilateral above knee amputations. No redness, swelling, drainage, or discharge present. TTP along the medial aspect of the left lower femur.  Skin:    General: Skin is warm and dry.     Capillary Refill: Capillary refill takes less than 2  seconds.  Neurological:     Mental Status: He is alert.  Psychiatric:        Mood and Affect: Mood normal.     (all labs ordered are listed, but only abnormal results are displayed) Labs Reviewed  CBC WITH DIFFERENTIAL/PLATELET - Abnormal; Notable for the following components:      Result Value   RBC 4.13 (*)    MCV 101.9 (*)    All other components within normal limits  COMPREHENSIVE METABOLIC PANEL WITH GFR - Abnormal; Notable for the following components:   Glucose, Bld 130 (*)    Creatinine, Ser 0.58 (*)    Total Protein 6.4 (*)    Albumin 3.4 (*)    All other components within normal limits    EKG: None  Radiology: DG HIP UNILAT WITH PELVIS 2-3 VIEWS LEFT Result Date: 03/15/2024 CLINICAL DATA:  Stump pain in lower extremity EXAM: DG HIP (WITH OR WITHOUT PELVIS) 2-3V LEFT COMPARISON:  None Available. FINDINGS: Status post amputation of left distal femur with expected post surgical changes at the stump area. No significant subcutaneous air or other abnormality. Osseous exostosis like particles along the anterior and posterior cortex at the tip of the stump noted. Degenerative changes of left hip joint. IMPRESSION: Left above knee amputation without suspicious finding at the soft tissue of the stump. Few exostosis like ossifications along the tip of the stump. Electronically Signed   By: Megan  Zare M.D.   On: 03/15/2024 15:25     Procedures   Medications Ordered in the ED  oxyCODONE -acetaminophen  (PERCOCET/ROXICET) 5-325 MG per tablet 1 tablet (1 tablet Oral Given 03/15/24 1840)                                    Medical Decision Making Amount and/or Complexity of Data Reviewed Labs: ordered. Radiology: ordered.  Risk Prescription drug management.   This patient presents to the ED for concern of leg pain.  Differential diagnosis includes cellulitis, DVT, edema, laceration   Lab Tests:  I Ordered, and personally interpreted labs.  The pertinent results include:   CBC unremarkable, CMP unremarkable   Imaging Studies ordered:  I ordered imaging studies including xray left hip I independently visualized and interpreted imaging which showed negative for any acute findings I agree with the radiologist interpretation   Medicines ordered and prescription drug management:  I ordered medication including Percocet  for pain  Reevaluation of the patient after these medicines showed that the patient improved I have reviewed the patients home medicines and have  made adjustments as needed   Problem List / ED Course:  Patient presents emergency department with concerns of left leg pain.  Patient has an above-the-knee amputation to bilateral legs endorsing pain to the left leg.  He currently takes gabapentin and Tylenol  for pain.  Reports a pins-and-needles type sensation in his legs that he finds to be unbearable.  No reported falls or other injuries recently. On exam, the left leg is unremarkable.  There is no swelling, tenderness, or erythema present.  Will proceed with imaging and basic labs for evaluation of possible infectious versus other etiology.  Spoke with patient's sister, Barnie, regarding his current symptomatic presentation and she advised that he takes gabapentin and Tylenol  for pain. Workup reassuring.  No obvious signs explain current pain.  On reevaluation after patient given Percocet, he reports improvement in pain. Will discharge patient home with a combination of Robaxin  and #5 Percocet for pain control. Advised that given type of pain, he likely would benefit from continued follow up with PCP and possibly neurology. Patient verbalized understanding and agreement. Otherwise stable at this time for outpatient follow up and discharged home.   Social Determinants of Health:  None  Final diagnoses:  Neuropathic pain of left thigh    ED Discharge Orders          Ordered    methocarbamol  (ROBAXIN ) 500 MG tablet  2 times daily         03/15/24 1945    oxyCODONE -acetaminophen  (PERCOCET/ROXICET) 5-325 MG tablet  Every 6 hours PRN        03/15/24 1945               Jinelle Butchko A, PA-C 03/15/24 2108    Kammerer, Megan L, DO 03/17/24 1900

## 2024-03-15 NOTE — ED Notes (Signed)
 Ptar called

## 2024-03-15 NOTE — Discharge Instructions (Addendum)
 You were seen in the ER today for concerns of leg pain. Your left leg thankfully appears well on xray and your labs were reassuring. I believe you have had a flare up of pain likely from your newer wheelchair and your pain appears to be primarily neuropathic. You would benefit from further evaluation from your primary care provider for assessment and management of this pain. I have sent you further pain medications to help with your symptoms over the next several days.

## 2024-03-15 NOTE — ED Notes (Signed)
 Pt's sister called to make aware that he was up for D/C. Sister states she can not come to get pt d/t unable to drive at night. She requested the ambulance service to bring pt home

## 2024-03-15 NOTE — ED Triage Notes (Signed)
 Patient BIB [facility] from work for L stump pain, 10/10 and described as pins and needles. Patient took tylenol  PTA of EMS and improved to a 9/10.  BP 150/100 HR 85 RR 18 96% RA CBG 124
